# Patient Record
Sex: Female | Born: 1995
Health system: Southern US, Community
[De-identification: ages and names within clinical notes are randomized; demographics above are authoritative.]

## PROBLEM LIST (undated history)

## (undated) ENCOUNTER — Inpatient Hospital Stay (HOSPITAL_COMMUNITY): Payer: Self-pay

## (undated) DIAGNOSIS — M92522 Juvenile osteochondrosis of tibia tubercle, left leg: Secondary | ICD-10-CM

## (undated) DIAGNOSIS — L709 Acne, unspecified: Secondary | ICD-10-CM

## (undated) DIAGNOSIS — K219 Gastro-esophageal reflux disease without esophagitis: Secondary | ICD-10-CM

## (undated) DIAGNOSIS — E282 Polycystic ovarian syndrome: Secondary | ICD-10-CM

## (undated) DIAGNOSIS — M9252 Juvenile osteochondrosis of tibia and fibula, left leg: Secondary | ICD-10-CM

## (undated) HISTORY — DX: Gastro-esophageal reflux disease without esophagitis: K21.9

## (undated) HISTORY — DX: Polycystic ovarian syndrome: E28.2

## (undated) HISTORY — DX: Juvenile osteochondrosis of tibia tubercle, left leg: M92.522

## (undated) HISTORY — PX: NO PAST SURGERIES: SHX2092

## (undated) HISTORY — DX: Juvenile osteochondrosis of tibia and fibula, left leg: M92.52

---
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
## 2015-07-09 ENCOUNTER — Ambulatory Visit: Payer: 59 | Admitting: Internal Medicine

## 2015-07-09 DIAGNOSIS — Z0289 Encounter for other administrative examinations: Secondary | ICD-10-CM

## 2015-08-01 ENCOUNTER — Other Ambulatory Visit: Payer: Self-pay | Admitting: Internal Medicine

## 2015-08-24 ENCOUNTER — Other Ambulatory Visit: Payer: Self-pay | Admitting: Internal Medicine

## 2015-08-29 ENCOUNTER — Other Ambulatory Visit: Payer: Self-pay | Admitting: Internal Medicine

## 2015-09-15 ENCOUNTER — Other Ambulatory Visit: Payer: Self-pay | Admitting: Internal Medicine

## 2015-10-14 ENCOUNTER — Other Ambulatory Visit: Payer: Self-pay | Admitting: Internal Medicine

## 2015-10-17 ENCOUNTER — Other Ambulatory Visit: Payer: Self-pay | Admitting: Internal Medicine

## 2015-10-25 ENCOUNTER — Ambulatory Visit (INDEPENDENT_AMBULATORY_CARE_PROVIDER_SITE_OTHER): Payer: BLUE CROSS/BLUE SHIELD | Admitting: Family

## 2015-10-25 ENCOUNTER — Encounter: Payer: Self-pay | Admitting: Family

## 2015-10-25 VITALS — BP 128/84 | HR 76 | Temp 98.4°F | Resp 16 | Ht 65.0 in | Wt 166.0 lb

## 2015-10-25 DIAGNOSIS — K219 Gastro-esophageal reflux disease without esophagitis: Secondary | ICD-10-CM | POA: Diagnosis not present

## 2015-10-25 DIAGNOSIS — M7652 Patellar tendinitis, left knee: Secondary | ICD-10-CM

## 2015-10-25 DIAGNOSIS — Z23 Encounter for immunization: Secondary | ICD-10-CM

## 2015-10-25 DIAGNOSIS — M7650 Patellar tendinitis, unspecified knee: Secondary | ICD-10-CM | POA: Insufficient documentation

## 2015-10-25 MED ORDER — NAPROXEN 375 MG PO TABS: ORAL_TABLET | ORAL | 0 refills | Status: DC

## 2015-10-25 MED ORDER — OMEPRAZOLE 20 MG PO CPDR: DELAYED_RELEASE_CAPSULE | ORAL | 0 refills | Status: DC

## 2015-10-25 NOTE — Patient Instructions (Addendum)
Thank you for choosing [facility].  SUMMARY AND INSTRUCTIONS:  Ice your knee 20 min every 2 hours as needed and after activity.  Exercises daily.  Continue to take the naproxen as needed.   Medication:  Your prescription(s) have been submitted to your pharmacy or been printed and provided for you. Please take as directed and contact our office if you believe you are having problem(s) with the medication(s) or have any questions.  Follow up:  If your symptoms worsen or fail to improve, please contact our office for further instruction, or in case of emergency go directly to the emergency room at the closest medical facility.    Osgood-Schlatter Disease With Rehab Osgood-Schlatter disease affects the growth plate of the shinbone (tibia) just below the knee joint. The condition involves pain and inflammation below the knee. The tibial tubercle is a bony bump (prominence) below the knee, where the patellar tendon attaches to the shinbone. The patellar tendon is connected to the quadriceps thigh muscles, which are responsible for straightening the knee and bending the hip. In skeletally immature individuals, the tibial tubercle contains a growth plate that is vulnerable to injury, from stress placed on it by the patellar tendon. Osgood-Schlatter disease is a temporary condition that typically goes away with skeletal maturity (at about 20 years of age). SYMPTOMS   A slightly swollen, warm, and tender bump below the knee, where the patellar tendon inserts.  Pain with activity, especially straightening the leg against force (stair climbing, jumping, deep knee bends, weight-lifting) or following an extended period of vigorous exercise in an adolescent. In more severe cases, pain occurs during less vigorous activity. CAUSES  Osgood-Schlatter disease is caused by repeated stress to the tibial tubercle growth plate. This stress causes the area to become inflamed, resulting in pain.  RISK  INCREASES WITH:   Conditioning routines that are too strenuous, such as running, jumping, or jogging.  Being overweight.  Boys ages 2611 to 1318.  Rapid skeletal growth.  Poor strength and flexibility. PREVENTION  Maintain a healthy body weight.  Warm up and stretch properly before activity.  Allow for adequate recovery between workouts.  Learn and use proper exercise technique.  Maintain physical fitness:  Strength, flexibility, and endurance.  Cardiovascular fitness. PROGNOSIS  The outcome for Osgood-Schlatter disease depends on the severity of the condition. Mild cases may be resolved with a slight reduction of activity level. However, moderate to severe cases may require significantly reduced activity and, sometimes, restraining the knee for 3 to 4 months.  RELATED COMPLICATIONS   Bone infection.  Recurrence of the condition in adulthood, resulting in (symptomatic) bone fragments below the affected knee (ossicle).  Persisting bump, below the kneecap. TREATMENT Treatment first involves the use of ice and medicine, to reduce pain and inflammation. The use of strengthening and stretching exercises may help reduce pain with activity, especially exercising the quadriceps and hamstrings (thigh) muscles. These exercises may be performed at home or with a therapist. Activities that cause symptoms to get worse should be avoided, until symptoms begin to go away. Severe cases may be referred to a therapist for further evaluation and treatment. Uncommonly, the affected knee may be restrained for 6 to 8 weeks. Your caregiver may advise the use of a brace between kneecap and tibial tubercle, on top of the patellar tendon (patellar band), that may help relieve symptoms. Surgery is rarely needed in a skeletally immature individual, but it may be considered for skeletally mature individuals.  MEDICATION   If  pain medicine is needed, nonsteroidal anti-inflammatory medicines (aspirin and  ibuprofen), or other minor pain relievers (acetaminophen), are often advised.  Do not take pain medicine for 7 days before surgery.  Prescription pain relievers may be given, if your caregiver thinks they are needed. Use only as directed and only as much as you need. HEAT AND COLD  Cold treatment (icing) should be applied for 10 to 15 minutes every 2 to 3 hours for inflammation and pain, and immediately after activity that aggravates your symptoms. Use ice packs or an ice massage.  Heat treatment may be used before performing stretching and strengthening activities prescribed by your caregiver, physical therapist, or athletic trainer. Use a heat pack or a warm water soak. SEEK MEDICAL CARE IF:  Symptoms get worse or do not improve in 4 weeks, despite treatment.  You develop a fever greater than 102 F (38.9 C). EXERCISES RANGE OF MOTION (ROM) AND STRETCHING EXERCISES - Osgood-Schlatter Disease (Osteochondrosis, Apophysitis of the Tibial Tubercle) These exercises may help you when beginning to rehabilitate your injury. Your symptoms may resolve with or without further involvement from your physician, physical therapist or athletic trainer. While completing these exercises, remember:   Restoring tissue flexibility helps normal motion to return to the joints. This allows healthier, less painful movement and activity.  An effective stretch should be held for at least 30 seconds.  A stretch should never be painful. You should only feel a gentle lengthening or release in the stretched tissue. STRETCH - Quadriceps, Prone  Lie on your stomach on a firm surface, such as a bed or padded floor.  Bend your right / left knee and grasp your ankle. If you are unable to reach your ankle or pant leg, use a belt around your foot to lengthen your reach.  Gently pull your heel toward your buttocks. Your knee should not slide out to the side. You should feel a stretch in the front of your thigh and  knee.  Hold this position for __________ seconds. Repeat __________ times. Complete this stretch __________ times per day.  STRETCH - Hamstrings, Supine  Lie on your back. Loop a belt or towel over the ball of your right / left foot.  Straighten your right / left knee and slowly pull on the belt to raise your leg. Do not allow the right / left knee to bend Keep your opposite leg flat on the floor.  Raise the leg until you feel a gentle stretch behind your right / left knee or thigh. Hold this position for __________ seconds. Repeat __________ times. Complete this stretch __________ times per day.  STRETCH - Hamstrings, Doorway  Lie on your back with your right / left leg extended and resting on the wall, and the opposite leg flat on the ground, through the door. At first, position your bottom farther away from the wall.  Keep your right / left knee straight. If you feel a stretch behind your knee or thigh, hold this position for __________ seconds.  If you do not feel a stretch, scoot your bottom closer to the door, and hold __________ seconds. Repeat __________ times. Complete this stretch __________ times per day.  STRETCH - Hamstrings, Standing  Stand or sit and extend your right / left leg, placing your foot on a chair or foot stool.  Keep a slight arch in your low back and your hips straight forward.  Lead with your chest and lean forward at the waist until you feel a gentle  stretch in the back of your right / left knee or thigh. (When done correctly, this exercise requires leaning only a small distance.)  Hold this position for __________ seconds. Repeat __________ times. Complete this stretch __________ times per day. STRENGTHENING EXERCISES - Osgood-Schlatter Disease (Osteochondrosis, Apophysitis of the Tibial Tubercle) These exercises may help you when beginning to rehabilitate your injury. They may resolve your symptoms with or without further involvement from your physician,  physical therapist or athletic trainer. While completing these exercises, remember:   Muscles can gain both the endurance and the strength needed for everyday activities through controlled exercises.  Complete these exercises as instructed by your physician, physical therapist or athletic trainer. Increase the resistance and repetitions only as guided by your caregiver. STRENGTH - Quadriceps, Isometrics  Lie on your back with your right / left leg extended and your opposite knee bent.  Gradually tense the muscles in the front of your right / left thigh. You should see either your knee cap slide up toward your hip or increased dimpling just above the knee. This motion will push the back of the knee down toward the floor, mat, or bed on which you are lying.  Hold the muscle as tight as you can, without increasing your pain, for __________ seconds.  Relax the muscles slowly and completely in between each repetition. Repeat __________ times. Complete this exercise __________ times per day.  STRENGTH - Quadriceps, Short Arcs   Lie on your back. Place a __________ inch towel roll under your right / left knee, so that the knee bends slightly.  Raise only your lower leg by tightening the muscles in the front of your thigh. Do not allow your thigh to rise.  Hold this position for __________ seconds. Repeat __________ times. Complete this exercise __________ times per day.  OPTIONAL ANKLE WEIGHTS: Begin with ____________________, but DO NOT exceed ____________________. Increase in 1 pound/0.5 kilogram increments. STRENGTH - Quadriceps, Straight Leg Raises Quality counts! Watch for signs that the quadriceps muscle is working, to be sure you are strengthening the correct muscles and not "cheating" by substituting with healthier muscles.  Lay on your back with your right / left leg extended and your opposite knee bent.  Tense the muscles in the front of your right / left thigh. You should see either  your knee cap slide up or increased dimpling just above the knee. Your thigh may even shake a bit.  Tighten these muscles even more and raise your leg 4 to 6 inches off the floor. Hold for __________ seconds.  Keeping these muscles tense, lower your leg.  Relax the muscles slowly and completely between each repetition. Repeat __________ times. Complete this exercise __________ times per day.   This information is not intended to replace advice given to you by your health care provider. Make sure you discuss any questions you have with your health care provider.   Document Released: 01/30/2005 Document Revised: 10/21/2014 Document Reviewed: 05/14/2008 Elsevier Interactive Patient Education 2016 [employer].   Food Choices for Gastroesophageal Reflux Disease, Adult When you have gastroesophageal reflux disease (GERD), the foods you eat and your eating habits are very important. Choosing the right foods can help ease the discomfort of GERD. WHAT GENERAL GUIDELINES DO I NEED TO FOLLOW?  Choose fruits, vegetables, whole grains, low-fat dairy products, and low-fat meat, fish, and poultry.  Limit fats such as oils, salad dressings, butter, nuts, and avocado.  Keep a food diary to identify foods that cause symptoms.  Avoid foods that cause reflux. These may be different for different people.  Eat frequent small meals instead of three large meals each day.  Eat your meals slowly, in a relaxed setting.  Limit fried foods.  Cook foods using methods other than frying.  Avoid drinking alcohol.  Avoid drinking large amounts of liquids with your meals.  Avoid bending over or lying down until 2-3 hours after eating. WHAT FOODS ARE NOT RECOMMENDED? The following are some foods and drinks that may worsen your symptoms: Vegetables Tomatoes. Tomato juice. Tomato and spaghetti sauce. Chili peppers. Onion and garlic. Horseradish. Fruits Oranges, grapefruit, and lemon (fruit and  juice). Meats High-fat meats, fish, and poultry. This includes hot dogs, ribs, ham, sausage, salami, and bacon. Dairy Whole milk and chocolate milk. Sour cream. Cream. Butter. Ice cream. Cream cheese.  Beverages Coffee and tea, with or without caffeine. Carbonated beverages or energy drinks. Condiments Hot sauce. Barbecue sauce.  Sweets/Desserts Chocolate and cocoa. Donuts. Peppermint and spearmint. Fats and Oils High-fat foods, including [place] fries and potato chips. Other Vinegar. Strong spices, such as black pepper, white pepper, red pepper, cayenne, curry powder, cloves, ginger, and chili powder. The items listed above may not be a complete list of foods and beverages to avoid. Contact your dietitian for more information.   This information is not intended to replace advice given to you by your health care provider. Make sure you discuss any questions you have with your health care provider.   Document Released: 01/30/2005 Document Revised: 02/20/2014 Document Reviewed: 12/04/2012 Elsevier Interactive Patient Education [employer]

## 2015-10-25 NOTE — Assessment & Plan Note (Deleted)
Symptoms and exam consistent with patellar tendinitis given no tenderness over the tibial tuberosity or the inferior pole of the patella. Well controlled with naproxen. Not currently using nonpharmacological therapies. Encouraged to ice and initiate home exercise therapy. Continue current dosage of naproxen. Follow-up if symptoms worsen or fail to improve.

## 2015-10-25 NOTE — Assessment & Plan Note (Signed)
Symptoms of gastroesophageal reflux a periodically controlled with current regimen. Not currently following a modified diet. Information on foods to avoid provided and after visit summary. Continue current dosage of omeprazole.

## 2015-10-25 NOTE — Assessment & Plan Note (Signed)
Symptoms and exam consistent with patellar tendinitis given no tenderness over the tibial tuberosity or the inferior pole of the patella. Well controlled with naproxen. Not currently using nonpharmacological therapies. Encouraged to ice and initiate home exercise therapy. Continue current dosage of naproxen. Follow-up if symptoms worsen or fail to improve. 

## 2015-10-25 NOTE — Progress Notes (Signed)
Subjective:    Patient ID: Amanda Harper, female    DOB: 1995/10/30, 20 y.o.   MRN: [id number]  Chief Complaint  Patient presents with  . Medication Refill    needs refills of all medications, has congestion    HPI:  Amanda Harper is a 20 y.o. female who  has a past medical history of GERD (gastroesophageal reflux disease); Osgood-Harper's disease of left knee; and PCOS (polycystic ovarian syndrome). and presents today for a follow up office visit.  1.) GERD - Currently maintained on omeprazole. Reports taking the medication as prescribed and denies adverse side effects. Symptoms are generally well controlled with the current regimen. Not currently following a GERD related diet.  2.) Amanda Harper - Currently maintained on naproxen. Reports taking the medication one time per day. Continues to experience pain located in her left knee described as sharp, dull and achy. Aggrevated after sitting for long periods of time. Has not been doing any exercises or non-pharmacological therapies.    Allergies  Allergen Reactions  . Other Rash    Henna dye      Outpatient Medications Prior to Visit  Medication Sig Dispense Refill  . norgestimate-ethinyl estradiol (ORTHO-CYCLEN,SPRINTEC,PREVIFEM) 0.25-35 MG-MCG tablet Take 1 tablet by mouth daily. 1 Package 11  . naproxen (NAPROSYN) 375 MG tablet TAKE 1 TABLET EVERY DAY AS NEEDED FOR PAIN 30 tablet 0  . omeprazole (PRILOSEC) 20 MG capsule TAKE 1 CAPSULE BY MOUTH DAILY. YEARLY PHYSICAL W/LABS ARE DUE MUST SEE MD FOR REFILLS 30 capsule 0  . cefUROXime (CEFTIN) 500 MG tablet Take 1 tablet (500 mg total) by mouth 2 (two) times daily. 20 tablet 0   No facility-administered medications prior to visit.     Review of Systems  Constitutional: Negative for chills and fever.  Respiratory: Negative for chest tightness and shortness of breath.   Cardiovascular: Negative for chest pain.  Gastrointestinal: Negative for abdominal pain,  constipation, diarrhea, nausea and vomiting.  Neurological: Negative for weakness and numbness.      Objective:    BP 128/84 (BP Location: Left Arm, Patient Position: Sitting, Cuff Size: Normal)   Pulse 76   Temp 98.4 F (36.9 C) (Oral)   Resp 16   Ht 5\' 5"  (1.651 m)   Wt 166 lb (75.3 kg)   SpO2 99%   BMI 27.62 kg/m  Nursing note and vital signs reviewed.  Physical Exam  Constitutional: She is oriented to person, place, and time. She appears well-developed and well-nourished. No distress.  Cardiovascular: Normal rate, regular rhythm, normal heart sounds and intact distal pulses.   Pulmonary/Chest: Effort normal and breath sounds normal.  Abdominal: Normal appearance and bowel sounds are normal. She exhibits no mass. There is no hepatosplenomegaly. There is no tenderness. There is no rigidity, no rebound, no guarding, no tenderness at McBurney's point and negative Murphy's sign.  Musculoskeletal:  Left knee - no obvious deformity, discoloration, or edema. Mild tenderness located along medial and lateral aspects of patellar tendon. No crepitus or deformities appreciated. Range of motion within normal limits. Strength is 5+. Ligamentous and meniscal testing are negative. Distal pulses and sensation are intact and appropriate.  Neurological: She is alert and oriented to person, place, and time.  Skin: Skin is warm and dry.  Psychiatric: She has a normal mood and affect. Her behavior is normal. Judgment and thought content normal.       Assessment & Plan:   Problem List Items Addressed This Visit  Digestive   GERD (gastroesophageal reflux disease) - Primary    Symptoms of gastroesophageal reflux a periodically controlled with current regimen. Not currently following a modified diet. Information on foods to avoid provided and after visit summary. Continue current dosage of omeprazole.      Relevant Medications   omeprazole (PRILOSEC) 20 MG capsule     Musculoskeletal and  Integument   Patellar tendinitis    Symptoms and exam consistent with patellar tendinitis given no tenderness over the tibial tuberosity or the inferior pole of the patella. Well controlled with naproxen. Not currently using nonpharmacological therapies. Encouraged to ice and initiate home exercise therapy. Continue current dosage of naproxen. Follow-up if symptoms worsen or fail to improve.      Relevant Medications   naproxen (NAPROSYN) 375 MG tablet    Other Visit Diagnoses    Encounter for immunization       Relevant Medications   omeprazole (PRILOSEC) 20 MG capsule   naproxen (NAPROSYN) 375 MG tablet   Other Relevant Orders   Flu Vaccine QUAD 36+ mos IM (Completed)   Tdap vaccine greater than or equal to 7yo IM (Completed)   Need for diphtheria-tetanus-pertussis (Tdap) vaccine, adult/adolescent       Relevant Orders   Tdap vaccine greater than or equal to 7yo IM (Completed)       I have discontinued Amanda Harper's cefUROXime. I have also changed her omeprazole. Additionally, I am having her maintain her norgestimate-ethinyl estradiol and naproxen.   Meds ordered this encounter  Medications  . omeprazole (PRILOSEC) 20 MG capsule    Sig: TAKE 1 CAPSULE BY MOUTH DAILY.    Dispense:  90 capsule    Refill:  0    PT REQUESTS REFILL    Order Specific Question:   Supervising Provider    Answer:   Hillard Danker A [[id number]]  . naproxen (NAPROSYN) 375 MG tablet    Sig: TAKE 1 TABLET EVERY DAY AS NEEDED FOR PAIN    Dispense:  90 tablet    Refill:  0    PT REQUESTS REFILL    Order Specific Question:   Supervising Provider    Answer:   Myrlene Broker [[id number]]     Follow-up: Return in about 3 months (around 01/24/2016), or if symptoms worsen or fail to improve.  Jeanine Luz, FNP

## 2015-11-15 ENCOUNTER — Ambulatory Visit (INDEPENDENT_AMBULATORY_CARE_PROVIDER_SITE_OTHER): Payer: BLUE CROSS/BLUE SHIELD | Admitting: Internal Medicine

## 2015-11-15 ENCOUNTER — Other Ambulatory Visit (INDEPENDENT_AMBULATORY_CARE_PROVIDER_SITE_OTHER): Payer: BLUE CROSS/BLUE SHIELD

## 2015-11-15 ENCOUNTER — Encounter: Payer: Self-pay | Admitting: Internal Medicine

## 2015-11-15 VITALS — BP 120/70 | HR 84 | Temp 98.5°F | Resp 16 | Ht 65.0 in | Wt 167.2 lb

## 2015-11-15 DIAGNOSIS — H833X3 Noise effects on inner ear, bilateral: Secondary | ICD-10-CM | POA: Diagnosis not present

## 2015-11-15 DIAGNOSIS — Z0000 Encounter for general adult medical examination without abnormal findings: Secondary | ICD-10-CM

## 2015-11-15 HISTORY — DX: Noise effects on inner ear, bilateral: H83.3X3

## 2015-11-15 LAB — LIPID PANEL
CHOLESTEROL: 185 mg/dL (ref 0–200)
HDL: 54.4 mg/dL (ref >39.00)
LDL CALC: 108 mg/dL — AB (ref 0–99)
NonHDL: 130.97
TRIGLYCERIDES: 114 mg/dL (ref 0.0–149.0)
Total CHOL/HDL Ratio: 3
VLDL: 22.8 mg/dL (ref 0.0–40.0)

## 2015-11-15 LAB — CBC WITH DIFFERENTIAL/PLATELET
BASOS ABS: 0 10*3/uL (ref 0.0–0.1)
Basophils Relative: 0.5 % (ref 0.0–3.0)
Eosinophils Absolute: 0.1 10*3/uL (ref 0.0–0.7)
Eosinophils Relative: 1.5 % (ref 0.0–5.0)
HCT: 36.7 % (ref 36.0–46.0)
Hemoglobin: 12.4 g/dL (ref 12.0–15.0)
LYMPHS ABS: 2.4 10*3/uL (ref 0.7–4.0)
Lymphocytes Relative: 48 % — ABNORMAL HIGH (ref 12.0–46.0)
MCHC: 33.8 g/dL (ref 30.0–36.0)
MCV: 77.8 fl — ABNORMAL LOW (ref 78.0–100.0)
MONO ABS: 0.4 10*3/uL (ref 0.1–1.0)
Monocytes Relative: 7.8 % (ref 3.0–12.0)
NEUTROS PCT: 42.2 % — AB (ref 43.0–77.0)
Neutro Abs: 2.1 10*3/uL (ref 1.4–7.7)
Platelets: 274 10*3/uL (ref 150.0–400.0)
RBC: 4.71 Mil/uL (ref 3.87–5.11)
RDW: 13.1 % (ref 11.5–14.6)
WBC: 4.9 10*3/uL (ref 4.5–10.5)

## 2015-11-15 LAB — TSH: TSH: 0.94 u[IU]/mL (ref 0.35–5.50)

## 2015-11-15 LAB — COMPREHENSIVE METABOLIC PANEL
ALT: 10 U/L (ref 0–35)
AST: 12 U/L (ref 0–37)
Albumin: 3.5 g/dL (ref 3.5–5.2)
Alkaline Phosphatase: 58 U/L (ref 39–117)
BILIRUBIN TOTAL: 0.3 mg/dL (ref 0.2–1.2)
BUN: 10 mg/dL (ref 6–23)
CO2: 27 meq/L (ref 19–32)
Calcium: 8.9 mg/dL (ref 8.4–10.5)
Chloride: 104 mEq/L (ref 96–112)
Creatinine, Ser: 0.59 mg/dL (ref 0.40–1.20)
GFR: 165.99 mL/min (ref >60.00)
Glucose, Bld: 88 mg/dL (ref 70–99)
Potassium: 3.6 mEq/L (ref 3.5–5.1)
SODIUM: 138 meq/L (ref 135–145)
TOTAL PROTEIN: 7.4 g/dL (ref 6.0–8.3)

## 2015-11-15 NOTE — Progress Notes (Signed)
Pre visit review using our clinic review tool, if applicable. No additional management support is needed unless otherwise documented below in the visit note. 

## 2015-11-15 NOTE — Progress Notes (Signed)
Subjective:  Patient ID: Amanda Harper, female    DOB: 1995/03/25  Age: 20 y.o. MRN: [id number]  CC: Annual Exam   HPI Amanda Harper presents for a CPX.  She complains of gradual, worsening hearing loss in both ears over the last 3 months. She admits that she wears ear buds multiple hours during the day and night and listens to loud music. She denies ringing in Amanda Harper ears, ear pain, dizziness, vertigo, headache, or rash.  Outpatient Medications Prior to Visit  Medication Sig Dispense Refill  . naproxen (NAPROSYN) 375 MG tablet TAKE 1 TABLET EVERY DAY AS NEEDED FOR PAIN 90 tablet 0  . norgestimate-ethinyl estradiol (ORTHO-CYCLEN,SPRINTEC,PREVIFEM) 0.25-35 MG-MCG tablet Take 1 tablet by mouth daily. 1 Package 11  . omeprazole (PRILOSEC) 20 MG capsule TAKE 1 CAPSULE BY MOUTH DAILY. 90 capsule 0   No facility-administered medications prior to visit.     ROS Review of Systems  Constitutional: Negative.  Negative for chills, fatigue and fever.  HENT: Positive for hearing loss. Negative for congestion, facial swelling, sinus pressure, sore throat and trouble swallowing.   Eyes: Negative.  Negative for photophobia and visual disturbance.  Respiratory: Negative.  Negative for cough, choking, chest tightness, shortness of breath and stridor.   Cardiovascular: Negative.  Negative for chest pain, palpitations and leg swelling.  Gastrointestinal: Negative.  Negative for abdominal pain, constipation, diarrhea, nausea and vomiting.  Endocrine: Negative.   Genitourinary: Negative.  Negative for difficulty urinating, menstrual problem and pelvic pain.  Musculoskeletal: Negative.  Negative for arthralgias, back pain, joint swelling and myalgias.  Skin: Negative.   Allergic/Immunologic: Negative.   Neurological: Negative.  Negative for dizziness, facial asymmetry, numbness and headaches.  Hematological: Negative.  Negative for adenopathy. Does not bruise/bleed easily.  Psychiatric/Behavioral:  Negative.     Objective:  BP 120/70 (BP Location: Left Arm, Patient Position: Sitting, Cuff Size: Normal)   Pulse 84   Temp 98.5 F (36.9 C) (Oral)   Resp 16   Ht 5\' 5"  (1.651 m)   Wt 167 lb 4 oz (75.9 kg)   SpO2 98%   BMI 27.83 kg/m   BP Readings from Last 3 Encounters:  11/15/15 120/70  10/25/15 128/84  07/29/14 118/70    Wt Readings from Last 3 Encounters:  11/15/15 167 lb 4 oz (75.9 kg)  10/25/15 166 lb (75.3 kg)  07/29/14 203 lb (92.1 kg) (98 %, Z= 1.99)*   * Growth percentiles are based on CDC 2-20 Years data.    Physical Exam  Constitutional: She is oriented to person, place, and time. She appears well-developed and well-nourished. No distress.  HENT:  Head: Normocephalic and atraumatic.  Right Ear: Tympanic membrane, external ear and ear canal normal. No mastoid tenderness. Decreased hearing is noted.  Left Ear: Tympanic membrane, external ear and ear canal normal. No mastoid tenderness. Decreased hearing is noted.  Nose: Nose normal.  Mouth/Throat: Oropharynx is clear and moist. No oropharyngeal exudate.  Eyes: Conjunctivae are normal. Right eye exhibits no discharge. Left eye exhibits no discharge. No scleral icterus.  Neck: Normal range of motion. Neck supple. No JVD present. No tracheal deviation present. No thyromegaly present.  Cardiovascular: Normal rate, regular rhythm, normal heart sounds and intact distal pulses.  Exam reveals no gallop and no friction rub.   No murmur heard. Pulmonary/Chest: Effort normal and breath sounds normal. No stridor. No respiratory distress. She has no wheezes. She has no rales. She exhibits no tenderness.  Abdominal: Soft. Bowel sounds are normal.  She exhibits no distension and no mass. There is no tenderness. There is no rebound and no guarding.  Musculoskeletal: Normal range of motion. She exhibits no edema, tenderness or deformity.  Lymphadenopathy:    She has no cervical adenopathy.  Neurological: She is oriented to  person, place, and time.  Skin: Skin is warm and dry. No rash noted. She is not diaphoretic. No erythema. No pallor.  Vitals reviewed.   Lab Results  Component Value Date   WBC 4.9 11/15/2015   HGB 12.4 11/15/2015   HCT 36.7 11/15/2015   PLT 274.0 11/15/2015   GLUCOSE 88 11/15/2015   CHOL 185 11/15/2015   TRIG 114.0 11/15/2015   HDL 54.40 11/15/2015   LDLCALC 108 (H) 11/15/2015   ALT 10 11/15/2015   AST 12 11/15/2015   NA 138 11/15/2015   K 3.6 11/15/2015   CL 104 11/15/2015   CREATININE 0.59 11/15/2015   BUN 10 11/15/2015   CO2 27 11/15/2015   TSH 0.94 11/15/2015    No results found.  Assessment & Plan:   Amanda Harper was seen today for annual exam.  Diagnoses and all orders for this visit:  Routine general medical examination at a health care facility- exam completed, labs ordered and reviewed, Amanda Harper Pap is up-to-date, patient education material was given. -     Lipid panel; Future -     CBC with Differential/Platelet; Future -     Comprehensive metabolic panel; Future -     TSH; Future  Noise-induced hearing loss of both ears- audiology referral -     Ambulatory referral to Audiology   I am having Amanda Harper maintain Amanda Harper norgestimate-ethinyl estradiol, omeprazole, naproxen, and EPIDUO.  Meds ordered this encounter  Medications  . EPIDUO 0.1-2.5 % gel    Sig: APPLY A THIN LAYER ONCE NIGHTLY TO THE FACE, CHEST, AND BACK    Refill:  5     Follow-up: Return if symptoms worsen or fail to improve.  Sanda Lingerhomas Zameria Vogl, MD

## 2015-11-15 NOTE — Patient Instructions (Signed)
Hearing Loss °Hearing loss is a partial or total loss of the ability to hear. This can be temporary or permanent, and it can happen in one or both ears. Hearing loss may be referred to as deafness. °Medical care is necessary to treat hearing loss properly and to prevent the condition from getting worse. Your hearing may partially or completely come back, depending on what caused your hearing loss and how severe it is. In some cases, hearing loss is permanent. °CAUSES °Common causes of hearing loss include:  °· Too much wax in the ear canal.   °· Infection of the ear canal or middle ear.   °· Fluid in the middle ear.   °· Injury to the ear or surrounding area.   °· An object stuck in the ear.   °· Prolonged exposure to loud sounds, such as music.   °Less common causes of hearing loss include:  °· Tumors in the ear.   °· Viral or bacterial infections, such as meningitis.   °· A hole in the eardrum (perforated eardrum). °· Problems with the hearing nerve that sends signals between the brain and the ear. °· Certain medicines.   °SYMPTOMS  °Symptoms of this condition may include: °· Difficulty telling the difference between sounds. °· Difficulty following a conversation when there is background noise. °· Lack of response to sounds in your environment. This may be most noticeable when you do not respond to startling sounds. °· Needing to turn up the volume on the television, radio, etc. °· Ringing in the ears. °· Dizziness. °· Pain in the ears. °DIAGNOSIS °This condition is diagnosed based on a physical exam and a hearing test (audiometry). The audiometry test will be performed by a hearing specialist (audiologist). You may also be referred to an ear, nose, and throat (ENT) specialist (otolaryngologist).  °TREATMENT °Treatment for recent onset of hearing loss may include:  °· Ear wax removal.   °· Being prescribed medicines to prevent infection (antibiotics).   °· Being prescribed medicines to reduce inflammation  (corticosteroids).   °HOME CARE INSTRUCTIONS °· If you were prescribed an antibiotic medicine, take it as told by your health care provider. Do not stop taking the antibiotic even if you start to feel better. °· Take over-the-counter and prescription medicines only as told by your health care provider. °· Avoid loud noises.   °· Return to your normal activities as told by your health care provider. Ask your health care provider what activities are safe for you. °· Keep all follow-up visits as told by your health care provider. This is important. °SEEK MEDICAL CARE IF:  °· You feel dizzy.   °· You develop new symptoms.   °· You vomit or feel nauseous.   °· You have a fever.   °SEEK IMMEDIATE MEDICAL CARE IF: °· You develop sudden changes in your vision.   °· You have severe ear pain.   °· You have new or increased weakness. °· You have a severe headache. °  °This information is not intended to replace advice given to you by your health care provider. Make sure you discuss any questions you have with your health care provider. °  °Document Released: 01/30/2005 Document Revised: 10/21/2014 Document Reviewed: 06/17/2014 °Elsevier Interactive Patient Education ©2016 [employer] ° °

## 2015-11-16 ENCOUNTER — Encounter: Payer: Self-pay | Admitting: Internal Medicine

## 2016-01-17 ENCOUNTER — Other Ambulatory Visit: Payer: Self-pay | Admitting: Family

## 2016-01-17 DIAGNOSIS — M7652 Patellar tendinitis, left knee: Secondary | ICD-10-CM

## 2016-01-17 DIAGNOSIS — K219 Gastro-esophageal reflux disease without esophagitis: Secondary | ICD-10-CM

## 2016-01-20 ENCOUNTER — Ambulatory Visit: Payer: BLUE CROSS/BLUE SHIELD | Admitting: Internal Medicine

## 2016-04-26 ENCOUNTER — Other Ambulatory Visit: Payer: Self-pay | Admitting: Internal Medicine

## 2016-04-26 DIAGNOSIS — M7652 Patellar tendinitis, left knee: Secondary | ICD-10-CM

## 2016-05-07 ENCOUNTER — Other Ambulatory Visit: Payer: Self-pay | Admitting: Internal Medicine

## 2016-05-07 DIAGNOSIS — M7652 Patellar tendinitis, left knee: Secondary | ICD-10-CM

## 2016-08-11 ENCOUNTER — Other Ambulatory Visit: Payer: Self-pay | Admitting: Internal Medicine

## 2016-08-11 DIAGNOSIS — M7652 Patellar tendinitis, left knee: Secondary | ICD-10-CM

## 2016-10-18 ENCOUNTER — Other Ambulatory Visit: Payer: Self-pay | Admitting: Internal Medicine

## 2016-10-18 DIAGNOSIS — K219 Gastro-esophageal reflux disease without esophagitis: Secondary | ICD-10-CM

## 2016-10-18 NOTE — Telephone Encounter (Signed)
Can not refill medication until patient has scheduled an appt. It is not needed until October. Can you call the patient for me please?

## 2016-10-18 NOTE — Telephone Encounter (Signed)
Mother states she will get patient to call office to schedule appt for October

## 2016-10-20 NOTE — Telephone Encounter (Signed)
Pt called to schedule. The first available apt that she was able to get in is on October 15th. She wanted to know if her medications could be filled until this apt.

## 2016-10-25 NOTE — Telephone Encounter (Signed)
Per office policy sent 30 day to local pharmacy until appt.../lmb  

## 2016-11-14 ENCOUNTER — Other Ambulatory Visit: Payer: Self-pay | Admitting: Internal Medicine

## 2016-11-14 DIAGNOSIS — M7652 Patellar tendinitis, left knee: Secondary | ICD-10-CM

## 2016-11-14 NOTE — Telephone Encounter (Signed)
Can you advise in PCP absence please?   Appt scheduled for 11/27/2016

## 2016-11-22 ENCOUNTER — Other Ambulatory Visit: Payer: Self-pay | Admitting: Internal Medicine

## 2016-11-22 DIAGNOSIS — K219 Gastro-esophageal reflux disease without esophagitis: Secondary | ICD-10-CM

## 2016-11-27 ENCOUNTER — Encounter: Payer: Self-pay | Admitting: Internal Medicine

## 2016-11-27 ENCOUNTER — Ambulatory Visit (INDEPENDENT_AMBULATORY_CARE_PROVIDER_SITE_OTHER): Payer: BLUE CROSS/BLUE SHIELD | Admitting: Internal Medicine

## 2016-11-27 ENCOUNTER — Other Ambulatory Visit (INDEPENDENT_AMBULATORY_CARE_PROVIDER_SITE_OTHER): Payer: BLUE CROSS/BLUE SHIELD

## 2016-11-27 VITALS — BP 110/74 | HR 83 | Temp 98.9°F | Resp 16 | Ht 65.0 in | Wt 160.0 lb

## 2016-11-27 DIAGNOSIS — E282 Polycystic ovarian syndrome: Secondary | ICD-10-CM | POA: Diagnosis not present

## 2016-11-27 DIAGNOSIS — Z23 Encounter for immunization: Secondary | ICD-10-CM | POA: Diagnosis not present

## 2016-11-27 DIAGNOSIS — Z0000 Encounter for general adult medical examination without abnormal findings: Secondary | ICD-10-CM

## 2016-11-27 DIAGNOSIS — K2100 Gastro-esophageal reflux disease with esophagitis, without bleeding: Secondary | ICD-10-CM

## 2016-11-27 DIAGNOSIS — J301 Allergic rhinitis due to pollen: Secondary | ICD-10-CM

## 2016-11-27 DIAGNOSIS — M7650 Patellar tendinitis, unspecified knee: Secondary | ICD-10-CM

## 2016-11-27 DIAGNOSIS — J309 Allergic rhinitis, unspecified: Secondary | ICD-10-CM | POA: Insufficient documentation

## 2016-11-27 DIAGNOSIS — L709 Acne, unspecified: Secondary | ICD-10-CM | POA: Diagnosis not present

## 2016-11-27 DIAGNOSIS — M7652 Patellar tendinitis, left knee: Secondary | ICD-10-CM | POA: Diagnosis not present

## 2016-11-27 LAB — LIPID PANEL
Cholesterol: 187 mg/dL (ref 0–200)
HDL: 60.6 mg/dL (ref >39.00)
LDL CALC: 112 mg/dL — AB (ref 0–99)
NONHDL: 126.72
Total CHOL/HDL Ratio: 3
Triglycerides: 73 mg/dL (ref 0.0–149.0)
VLDL: 14.6 mg/dL (ref 0.0–40.0)

## 2016-11-27 LAB — COMPREHENSIVE METABOLIC PANEL
ALBUMIN: 3.7 g/dL (ref 3.5–5.2)
ALK PHOS: 61 U/L (ref 39–117)
ALT: 9 U/L (ref 0–35)
AST: 11 U/L (ref 0–37)
BUN: 9 mg/dL (ref 6–23)
CO2: 27 mEq/L (ref 19–32)
Calcium: 9.2 mg/dL (ref 8.4–10.5)
Chloride: 103 mEq/L (ref 96–112)
Creatinine, Ser: 0.61 mg/dL (ref 0.40–1.20)
GFR: 158.15 mL/min (ref >60.00)
GLUCOSE: 104 mg/dL — AB (ref 70–99)
Potassium: 4 mEq/L (ref 3.5–5.1)
SODIUM: 136 meq/L (ref 135–145)
TOTAL PROTEIN: 7.2 g/dL (ref 6.0–8.3)
Total Bilirubin: 0.3 mg/dL (ref 0.2–1.2)

## 2016-11-27 LAB — CBC WITH DIFFERENTIAL/PLATELET
BASOS PCT: 0.7 % (ref 0.0–3.0)
Basophils Absolute: 0 10*3/uL (ref 0.0–0.1)
EOS PCT: 1.4 % (ref 0.0–5.0)
Eosinophils Absolute: 0.1 10*3/uL (ref 0.0–0.7)
HCT: 37.8 % (ref 36.0–46.0)
HEMOGLOBIN: 12.4 g/dL (ref 12.0–15.0)
LYMPHS ABS: 2.6 10*3/uL (ref 0.7–4.0)
Lymphocytes Relative: 54.4 % — ABNORMAL HIGH (ref 12.0–46.0)
MCHC: 32.7 g/dL (ref 30.0–36.0)
MCV: 80 fl (ref 78.0–100.0)
MONOS PCT: 8.7 % (ref 3.0–12.0)
Monocytes Absolute: 0.4 10*3/uL (ref 0.1–1.0)
NEUTROS PCT: 34.8 % — AB (ref 43.0–77.0)
Neutro Abs: 1.6 10*3/uL (ref 1.4–7.7)
Platelets: 240 10*3/uL (ref 150.0–400.0)
RBC: 4.72 Mil/uL (ref 3.87–5.11)
RDW: 13 % (ref 11.5–15.5)
WBC: 4.7 10*3/uL (ref 4.0–10.5)

## 2016-11-27 MED ORDER — NAPROXEN 375 MG PO TABS: ORAL_TABLET | ORAL | 1 refills | Status: DC

## 2016-11-27 MED ORDER — EPIDUO 0.1-2.5 % EX GEL: CUTANEOUS | 5 refills | Status: DC

## 2016-11-27 MED ORDER — NORGESTIMATE-ETH ESTRADIOL 0.25-35 MG-MCG PO TABS: 1.0000 | ORAL_TABLET | Freq: Every day | ORAL | 11 refills | Status: DC

## 2016-11-27 MED ORDER — LEVOCETIRIZINE DIHYDROCHLORIDE 5 MG PO TABS: 5.0000 mg | ORAL_TABLET | Freq: Every evening | ORAL | 1 refills | Status: DC

## 2016-11-27 NOTE — Progress Notes (Signed)
Subjective:  Patient ID: Amanda Harper, female    DOB: 07/21/1995  Age: 21 y.o. MRN: [id number]  CC: Annual Exam; Gastroesophageal Reflux; and Allergic Rhinitis    HPI CHRISTOL THETFORD presents for a CPX and RX refills. She complains of intermittent knee pain and wants a refill on her anti-inflammatory. Her acne is well-controlled with Epiduo. She complains of a several week history of nasal congestion, sneezing, and runny nose.  Outpatient Medications Prior to Visit  Medication Sig Dispense Refill  . omeprazole (PRILOSEC) 20 MG capsule Take 1 capsule (20 mg total) by mouth daily. 90 capsule 1  . EPIDUO 0.1-2.5 % gel APPLY A THIN LAYER ONCE NIGHTLY TO THE FACE, CHEST, AND BACK  5  . naproxen (NAPROSYN) 375 MG tablet TAKE 1 TABLET EVERY DAY AS NEEDED FOR PAIN 90 tablet 0  . norgestimate-ethinyl estradiol (ORTHO-CYCLEN,SPRINTEC,PREVIFEM) 0.25-35 MG-MCG tablet Take 1 tablet by mouth daily. 1 Package 11   No facility-administered medications prior to visit.     ROS Review of Systems  Constitutional: Negative.  Negative for appetite change, fatigue and unexpected weight change.  HENT: Positive for congestion, postnasal drip and rhinorrhea. Negative for facial swelling, sinus pain, sinus pressure, sore throat, trouble swallowing and voice change.   Eyes: Negative.   Respiratory: Negative.  Negative for cough, chest tightness, shortness of breath and wheezing.   Cardiovascular: Negative for chest pain, palpitations and leg swelling.  Gastrointestinal: Negative for abdominal pain, constipation, diarrhea, nausea and vomiting.  Endocrine: Negative.   Genitourinary: Negative.  Negative for difficulty urinating and menstrual problem.  Musculoskeletal: Positive for arthralgias. Negative for back pain, myalgias and neck pain.  Skin: Negative.  Negative for color change.  Allergic/Immunologic: Negative.   Neurological: Negative.  Negative for dizziness, weakness, numbness and headaches.    Hematological: Negative for adenopathy. Does not bruise/bleed easily.  Psychiatric/Behavioral: Negative.     Objective:  BP 110/74 (BP Location: Left Arm, Patient Position: Sitting, Cuff Size: Normal)   Pulse 83   Temp 98.9 F (37.2 C) (Oral)   Resp 16   Ht  (1.651 m)   Wt 160 lb (72.6 kg)   LMP 11/17/2016   SpO2 99%   BMI 26.63 kg/m   BP Readings from Last 3 Encounters:  11/27/16 110/74  11/15/15 120/70  10/25/15 128/84    Wt Readings from Last 3 Encounters:  11/27/16 160 lb (72.6 kg)  11/15/15 167 lb 4 oz (75.9 kg)  10/25/15 166 lb (75.3 kg)    Physical Exam  Constitutional: She is oriented to person, place, and time. No distress.  HENT:  Nose: Mucosal edema present. No rhinorrhea or sinus tenderness. No epistaxis.  Mouth/Throat: Oropharynx is clear and moist. No oropharyngeal exudate.  Eyes: Conjunctivae are normal. Right eye exhibits no discharge. Left eye exhibits no discharge. No scleral icterus.  Neck: Normal range of motion. Neck supple. No JVD present. No thyromegaly present.  Cardiovascular: Normal rate, regular rhythm and intact distal pulses.  Exam reveals no gallop and no friction rub.   No murmur heard. Pulmonary/Chest: Effort normal and breath sounds normal. No respiratory distress. She has no wheezes. She has no rales. She exhibits no tenderness.  Abdominal: Soft. Bowel sounds are normal. She exhibits no distension and no mass. There is no tenderness. There is no rebound and no guarding.  Musculoskeletal: Normal range of motion. She exhibits no edema, tenderness or deformity.  Lymphadenopathy:    She has no cervical adenopathy.  Neurological: She is alert  and oriented to person, place, and time.  Skin: Skin is warm and dry. No rash noted. She is not diaphoretic. No erythema. No pallor.  Psychiatric: She has a normal mood and affect. Her behavior is normal. Judgment and thought content normal.  Vitals reviewed.   Lab Results  Component Value  Date   WBC 4.7 11/27/2016   HGB 12.4 11/27/2016   HCT 37.8 11/27/2016   PLT 240.0 11/27/2016   GLUCOSE 104 (H) 11/27/2016   CHOL 187 11/27/2016   TRIG 73.0 11/27/2016   HDL 60.60 11/27/2016   LDLCALC 112 (H) 11/27/2016   ALT 9 11/27/2016   AST 11 11/27/2016   NA 136 11/27/2016   K 4.0 11/27/2016   CL 103 11/27/2016   CREATININE 0.61 11/27/2016   BUN 9 11/27/2016   CO2 27 11/27/2016   TSH 0.94 11/15/2015    No results found.  Assessment & Plan:   Kalman Shan was seen today for annual exam, gastroesophageal reflux and allergic rhinitis .  Diagnoses and all orders for this visit:  Need for influenza vaccination -     Flu Vaccine QUAD 36+ mos IM  Gastroesophageal reflux disease with esophagitis- her symptoms are well controlled with a PPI -     CBC with Differential/Platelet; Future  Patellar tendinitis, unspecified laterality- will continue the NSAID as needed -     naproxen (NAPROSYN) 375 MG tablet; TAKE 1 TABLET EVERY DAY AS NEEDED FOR PAIN  Acne, unspecified acne type -     EPIDUO 0.1-2.5 % gel; APPLY A THIN LAYER ONCE NIGHTLY TO THE FACE, CHEST, AND BACK  Patellar tendinitis of left knee  PCOS (polycystic ovarian syndrome)- she is doing well on the oral contraceptive -     norgestimate-ethinyl estradiol (ORTHO-CYCLEN,SPRINTEC,PREVIFEM) 0.25-35 MG-MCG tablet; Take 1 tablet by mouth daily. -     Comprehensive metabolic panel; Future  Routine general medical examination at a health care facility- exam completed, labs reviewed, she defers on a Pap smear since she has never been sexually active, patient education material was given. -     Lipid panel; Future  Seasonal allergic rhinitis due to pollen- she is not willing to do a nasal spray so will start an oral antihistamine. -     levocetirizine (XYZAL) 5 MG tablet; Take 1 tablet (5 mg total) by mouth every evening.   I am having Ms. Lamorte start on levocetirizine. I am also having her maintain her omeprazole, EPIDUO,  naproxen, and norgestimate-ethinyl estradiol.  Meds ordered this encounter  Medications  . EPIDUO 0.1-2.5 % gel    Sig: APPLY A THIN LAYER ONCE NIGHTLY TO THE FACE, CHEST, AND BACK    Dispense:  45 g    Refill:  5  . naproxen (NAPROSYN) 375 MG tablet    Sig: TAKE 1 TABLET EVERY DAY AS NEEDED FOR PAIN    Dispense:  90 tablet    Refill:  1  . norgestimate-ethinyl estradiol (ORTHO-CYCLEN,SPRINTEC,PREVIFEM) 0.25-35 MG-MCG tablet    Sig: Take 1 tablet by mouth daily.    Dispense:  1 Package    Refill:  11  . levocetirizine (XYZAL) 5 MG tablet    Sig: Take 1 tablet (5 mg total) by mouth every evening.    Dispense:  90 tablet    Refill:  1     Follow-up: Return if symptoms worsen or fail to improve.  Sanda Linger, MD

## 2016-11-27 NOTE — Patient Instructions (Signed)
Preventive Care 18-39 Years, Female Preventive care refers to lifestyle choices and visits with your health care provider that can promote health and wellness. What does preventive care include?  A yearly physical exam. This is also called an annual well check.  Dental exams once or twice a year.  Routine eye exams. Ask your health care provider how often you should have your eyes checked.  Personal lifestyle choices, including: ? Daily care of your teeth and gums. ? Regular physical activity. ? Eating a healthy diet. ? Avoiding tobacco and drug use. ? Limiting alcohol use. ? Practicing safe sex. ? Taking vitamin and mineral supplements as recommended by your health care provider. What happens during an annual well check? The services and screenings done by your health care provider during your annual well check will depend on your age, overall health, lifestyle risk factors, and family history of disease. Counseling Your health care provider may ask you questions about your:  Alcohol use.  Tobacco use.  Drug use.  Emotional well-being.  Home and relationship well-being.  Sexual activity.  Eating habits.  Work and work Statistician.  Method of birth control.  Menstrual cycle.  Pregnancy history.  Screening You may have the following tests or measurements:  Height, weight, and BMI.  Diabetes screening. This is done by checking your blood sugar (glucose) after you have not eaten for a while (fasting).  Blood pressure.  Lipid and cholesterol levels. These may be checked every 5 years starting at age 66.  Skin check.  Hepatitis C blood test.  Hepatitis B blood test.  Sexually transmitted disease (STD) testing.  BRCA-related cancer screening. This may be done if you have a family history of breast, ovarian, tubal, or peritoneal cancers.  Pelvic exam and Pap test. This may be done every 3 years starting at age 40. Starting at age 59, this may be done every 5  years if you have a Pap test in combination with an HPV test.  Discuss your test results, treatment options, and if necessary, the need for more tests with your health care provider. Vaccines Your health care provider may recommend certain vaccines, such as:  Influenza vaccine. This is recommended every year.  Tetanus, diphtheria, and acellular pertussis (Tdap, Td) vaccine. You may need a Td booster every 10 years.  Varicella vaccine. You may need this if you have not been vaccinated.  HPV vaccine. If you are 69 or younger, you may need three doses over 6 months.  Measles, mumps, and rubella (MMR) vaccine. You may need at least one dose of MMR. You may also need a second dose.  Pneumococcal 13-valent conjugate (PCV13) vaccine. You may need this if you have certain conditions and were not previously vaccinated.  Pneumococcal polysaccharide (PPSV23) vaccine. You may need one or two doses if you smoke cigarettes or if you have certain conditions.  Meningococcal vaccine. One dose is recommended if you are age 21-21 years and a first-year college student living in a residence hall, or if you have one of several medical conditions. You may also need additional booster doses.  Hepatitis A vaccine. You may need this if you have certain conditions or if you travel or work in places where you may be exposed to hepatitis A.  Hepatitis B vaccine. You may need this if you have certain conditions or if you travel or work in places where you may be exposed to hepatitis B.  Haemophilus influenzae type b (Hib) vaccine. You may need this if  you have certain risk factors.  Talk to your health care provider about which screenings and vaccines you need and how often you need them. This information is not intended to replace advice given to you by your health care provider. Make sure you discuss any questions you have with your health care provider. Document Released: 03/28/2001 Document Revised: 10/20/2015  Document Reviewed: 12/01/2014 Elsevier Interactive Patient Education  2017 [employer].

## 2016-11-29 ENCOUNTER — Encounter: Payer: Self-pay | Admitting: Internal Medicine

## 2017-03-07 ENCOUNTER — Ambulatory Visit: Payer: BLUE CROSS/BLUE SHIELD | Admitting: Internal Medicine

## 2017-04-25 ENCOUNTER — Other Ambulatory Visit: Payer: Self-pay | Admitting: Internal Medicine

## 2017-04-25 DIAGNOSIS — K219 Gastro-esophageal reflux disease without esophagitis: Secondary | ICD-10-CM

## 2017-05-06 ENCOUNTER — Other Ambulatory Visit: Payer: Self-pay | Admitting: Internal Medicine

## 2017-05-06 DIAGNOSIS — L700 Acne vulgaris: Secondary | ICD-10-CM

## 2017-05-06 DIAGNOSIS — L709 Acne, unspecified: Secondary | ICD-10-CM

## 2017-05-06 MED ORDER — ADAPALENE-BENZOYL PEROXIDE 0.1-2.5 % EX GEL: CUTANEOUS | 5 refills | Status: DC

## 2017-07-23 ENCOUNTER — Other Ambulatory Visit: Payer: Self-pay | Admitting: Internal Medicine

## 2017-07-23 DIAGNOSIS — M7650 Patellar tendinitis, unspecified knee: Secondary | ICD-10-CM

## 2017-08-01 ENCOUNTER — Telehealth: Payer: Self-pay | Admitting: Internal Medicine

## 2017-08-01 NOTE — Telephone Encounter (Signed)
Copied from CRM [id number]. Topic: Referral - Request >> Aug 01, 2017 10:32 AM [name], [name], NT wrote: Reason for CRM: pt. Calling to request referrals for a nutritionist and psychiatrist

## 2017-10-10 ENCOUNTER — Encounter: Payer: Self-pay | Admitting: Family

## 2017-10-10 ENCOUNTER — Other Ambulatory Visit (INDEPENDENT_AMBULATORY_CARE_PROVIDER_SITE_OTHER): Payer: BLUE CROSS/BLUE SHIELD

## 2017-10-10 ENCOUNTER — Ambulatory Visit: Payer: BLUE CROSS/BLUE SHIELD | Admitting: Family

## 2017-10-10 VITALS — BP 116/78 | HR 112 | Temp 98.2°F | Ht 65.0 in | Wt 169.0 lb

## 2017-10-10 DIAGNOSIS — K219 Gastro-esophageal reflux disease without esophagitis: Secondary | ICD-10-CM | POA: Diagnosis not present

## 2017-10-10 DIAGNOSIS — L709 Acne, unspecified: Secondary | ICD-10-CM

## 2017-10-10 DIAGNOSIS — L700 Acne vulgaris: Secondary | ICD-10-CM

## 2017-10-10 DIAGNOSIS — E282 Polycystic ovarian syndrome: Secondary | ICD-10-CM | POA: Diagnosis not present

## 2017-10-10 DIAGNOSIS — J301 Allergic rhinitis due to pollen: Secondary | ICD-10-CM | POA: Diagnosis not present

## 2017-10-10 DIAGNOSIS — Z23 Encounter for immunization: Secondary | ICD-10-CM | POA: Diagnosis not present

## 2017-10-10 DIAGNOSIS — M7650 Patellar tendinitis, unspecified knee: Secondary | ICD-10-CM

## 2017-10-10 LAB — CBC WITH DIFFERENTIAL/PLATELET
BASOS ABS: 0 10*3/uL (ref 0.0–0.1)
Basophils Relative: 0.5 % (ref 0.0–3.0)
EOS ABS: 0.1 10*3/uL (ref 0.0–0.7)
Eosinophils Relative: 1.1 % (ref 0.0–5.0)
HCT: 37.8 % (ref 36.0–46.0)
Hemoglobin: 12.4 g/dL (ref 12.0–15.0)
LYMPHS ABS: 2.4 10*3/uL (ref 0.7–4.0)
Lymphocytes Relative: 45.8 % (ref 12.0–46.0)
MCHC: 32.8 g/dL (ref 30.0–36.0)
MCV: 78.8 fl (ref 78.0–100.0)
Monocytes Absolute: 0.4 10*3/uL (ref 0.1–1.0)
Monocytes Relative: 8 % (ref 3.0–12.0)
NEUTROS ABS: 2.4 10*3/uL (ref 1.4–7.7)
NEUTROS PCT: 44.6 % (ref 43.0–77.0)
PLATELETS: 262 10*3/uL (ref 150.0–400.0)
RBC: 4.79 Mil/uL (ref 3.87–5.11)
RDW: 12.9 % (ref 11.5–15.5)
WBC: 5.3 10*3/uL (ref 4.0–10.5)

## 2017-10-10 LAB — COMPREHENSIVE METABOLIC PANEL
ALK PHOS: 53 U/L (ref 39–117)
ALT: 11 U/L (ref 0–35)
AST: 11 U/L (ref 0–37)
Albumin: 3.7 g/dL (ref 3.5–5.2)
BILIRUBIN TOTAL: 0.3 mg/dL (ref 0.2–1.2)
BUN: 8 mg/dL (ref 6–23)
CO2: 26 meq/L (ref 19–32)
Calcium: 9.2 mg/dL (ref 8.4–10.5)
Chloride: 103 mEq/L (ref 96–112)
Creatinine, Ser: 0.67 mg/dL (ref 0.40–1.20)
GFR: 140.8 mL/min (ref >60.00)
GLUCOSE: 92 mg/dL (ref 70–99)
Potassium: 4 mEq/L (ref 3.5–5.1)
Sodium: 136 mEq/L (ref 135–145)
TOTAL PROTEIN: 7.5 g/dL (ref 6.0–8.3)

## 2017-10-10 LAB — MAGNESIUM: MAGNESIUM: 1.8 mg/dL (ref 1.5–2.5)

## 2017-10-10 MED ORDER — CETIRIZINE HCL 10 MG PO TABS: 10.0000 mg | ORAL_TABLET | Freq: Every day | ORAL | 3 refills | Status: DC

## 2017-10-10 MED ORDER — NORGESTIMATE-ETH ESTRADIOL 0.25-35 MG-MCG PO TABS: 1.0000 | ORAL_TABLET | Freq: Every day | ORAL | 3 refills | Status: DC

## 2017-10-10 MED ORDER — OMEPRAZOLE 20 MG PO CPDR: 20.0000 mg | DELAYED_RELEASE_CAPSULE | Freq: Every day | ORAL | 1 refills | Status: DC

## 2017-10-10 MED ORDER — NAPROXEN 375 MG PO TABS: ORAL_TABLET | ORAL | 1 refills | Status: DC

## 2017-10-10 MED ORDER — ADAPALENE-BENZOYL PEROXIDE 0.1-2.5 % EX GEL: CUTANEOUS | 5 refills | Status: DC

## 2017-10-10 NOTE — Progress Notes (Signed)
Amanda Harper is a 22 y.o. female with the following history as recorded in EpicCare:  Patient Active Problem List   Diagnosis Date Noted  . Acne 11/27/2016  . Allergic rhinitis 11/27/2016  . Noise-induced hearing loss of both ears 11/15/2015  . Patellar tendinitis 10/25/2015  . GERD (gastroesophageal reflux disease) 06/08/2014  . Osgood-Schlatter's disease of left knee 06/08/2014  . PCOS (polycystic ovarian syndrome) 06/08/2014  . Routine general medical examination at a health care facility 06/08/2014  . Helicobacter pylori gastritis 07/11/2011    Current Outpatient Medications  Medication Sig Dispense Refill  . Adapalene-Benzoyl Peroxide (EPIDUO) 0.1-2.5 % gel APPLY A THIN LAYER ONCE NIGHTLY TO THE FACE, CHEST, AND BACK 45 g 5  . levocetirizine (XYZAL) 5 MG tablet Take 1 tablet (5 mg total) by mouth every evening. 90 tablet 1  . naproxen (NAPROSYN) 375 MG tablet Take one per day as directed 90 tablet 1  . norgestimate-ethinyl estradiol (ORTHO-CYCLEN,SPRINTEC,PREVIFEM) 0.25-35 MG-MCG tablet Take 1 tablet by mouth daily. 3 Package 3  . omeprazole (PRILOSEC) 20 MG capsule Take 1 capsule (20 mg total) by mouth daily. 90 capsule 1  . cetirizine (ZYRTEC) 10 MG tablet Take 1 tablet (10 mg total) by mouth daily. 90 tablet 3   No current facility-administered medications for this visit.     Allergies: Other  Past Medical History:  Diagnosis Date  . GERD (gastroesophageal reflux disease)   . Osgood-Schlatter's disease of left knee   . PCOS (polycystic ovarian syndrome)     History reviewed. No pertinent surgical history.  Family History  Problem Relation Age of Onset  . Hypertension Father   . Hypertension Sister   . Hypertension Maternal Grandmother   . Hypertension Maternal Grandfather   . Diabetes Maternal Grandfather   . Hyperlipidemia Paternal Grandmother   . Cancer Neg Hx   . Alcohol abuse Neg Hx   . Drug abuse Neg Hx   . Early death Neg Hx   . Heart disease Neg Hx   .  Kidney disease Neg Hx     Social History   Tobacco Use  . Smoking status: Never Smoker  . Smokeless tobacco: Never Used  Substance Use Topics  . Alcohol use: No    Subjective:  Presents with her mother today; seen to follow-up on chronic care needs including:  1) Acne; 2) GERD; 3) Chronic left knee pain; 4) allergic rhinitis; 5) PCOS- has been having recurrent right sided pelvic pain   Objective:  Vitals:   10/10/17 1400  BP: 116/78  Pulse: (!) 112  Temp: 98.2 F (36.8 C)  TempSrc: Oral  SpO2: 99%  Weight: 169 lb (76.7 kg)  Height: '5\' 5"'$  (1.651 m)    General: Well developed, well nourished, in no acute distress  Skin : Warm and dry.  Head: Normocephalic and atraumatic  Eyes: Sclera and conjunctiva clear; pupils round and reactive to light; extraocular movements intact  Ears: External normal; canals clear; tympanic membranes normal  Oropharynx: Pink, supple. No suspicious lesions  Neck: Supple without thyromegaly, adenopathy  Lungs: Respirations unlabored; clear to auscultation bilaterally without wheeze, rales, rhonchi  CVS exam: normal rate and regular rhythm.  Abdomen: Soft; nontender; nondistended; normoactive bowel sounds; no masses or hepatosplenomegaly  Musculoskeletal: No deformities; no active joint inflammation  Extremities: No edema, cyanosis, clubbing  Vessels: Symmetric bilaterally  Neurologic: Alert and oriented; speech intact; face symmetrical; moves all extremities well; CNII-XII intact without focal deficit  Assessment:  1. Gastroesophageal reflux disease, esophagitis presence  not specified   2. Acne, unspecified acne type   3. Acne vulgaris   4. Seasonal allergic rhinitis due to pollen   5. Patellar tendinitis, unspecified laterality   6. PCOS (polycystic ovarian syndrome)   7. Gastroesophageal reflux disease without esophagitis   8. Need for influenza vaccination     Plan:  1. Check magnesium; refill on Prilosec; refer to GI- concern for severity  of symptoms at such a young age; ? Need for endoscopy; 2. Stable; refill updated; 4. Rx for Zyrtec 10 mg daily; 5. Refer to Dr. Tamala Julian- concern for need for daily Naproxen; 6. Update Rx for OCP, update pelvic ultrasound; patient virgin- pap smear deferred at this time;  Return for Dr. Tamala Julian left knee pain.  Orders Placed This Encounter  Procedures  . US Pelvis Complete    Standing Status:   Future    Standing Expiration Date:   12/11/2018    Order Specific Question:   Reason for Exam (SYMPTOM  OR DIAGNOSIS REQUIRED)    Answer:   right sided pelvic pain    Order Specific Question:   Preferred imaging location?    Answer:   [facility]  . Flu Vaccine QUAD 36+ mos IM  . CBC w/Diff    Standing Status:   Future    Number of Occurrences:   1    Standing Expiration Date:   10/10/2018  . Comp Met (CMET)    Standing Status:   Future    Number of Occurrences:   1    Standing Expiration Date:   10/10/2018  . Magnesium    Standing Status:   Future    Number of Occurrences:   1    Standing Expiration Date:   10/10/2018  . Ambulatory referral to Gastroenterology    Referral Priority:   Routine    Referral Type:   Consultation    Referral Reason:   Specialty Services Required    Number of Visits Requested:   1    Requested Prescriptions   Signed Prescriptions Disp Refills  . Adapalene-Benzoyl Peroxide (EPIDUO) 0.1-2.5 % gel 45 g 5    Sig: APPLY A THIN LAYER ONCE NIGHTLY TO THE FACE, CHEST, AND BACK  . cetirizine (ZYRTEC) 10 MG tablet 90 tablet 3    Sig: Take 1 tablet (10 mg total) by mouth daily.  . naproxen (NAPROSYN) 375 MG tablet 90 tablet 1    Sig: Take one per day as directed  . norgestimate-ethinyl estradiol (ORTHO-CYCLEN,SPRINTEC,PREVIFEM) 0.25-35 MG-MCG tablet 3 Package 3    Sig: Take 1 tablet by mouth daily.  Marland Kitchen omeprazole (PRILOSEC) 20 MG capsule 90 capsule 1    Sig: Take 1 capsule (20 mg total) by mouth daily.

## 2017-10-24 ENCOUNTER — Ambulatory Visit
Admission: RE | Admit: 2017-10-24 | Discharge: 2017-10-24 | Disposition: A | Discharge status: Home / Self Care | Payer: BLUE CROSS/BLUE SHIELD | Source: Ambulatory Visit | Attending: Family | Admitting: Family

## 2017-10-24 ENCOUNTER — Other Ambulatory Visit: Payer: Self-pay | Admitting: Family

## 2017-10-24 DIAGNOSIS — E282 Polycystic ovarian syndrome: Secondary | ICD-10-CM

## 2017-10-29 ENCOUNTER — Ambulatory Visit: Payer: BLUE CROSS/BLUE SHIELD | Admitting: Family

## 2017-11-04 NOTE — Progress Notes (Signed)
Tawana ScaleZach Andree Golphin D.O. [facility] [address] [place], [place] [address] Phone: [phone] Subjective:   Bruce Donath, Valerie Wolf, am serving as a scribe for Dr. Antoine PrimasZachary Nehemie Casserly.  I'm seeing this patient by the request  of:  Olive BassMurray, Laura Woodruff, FNP   CC: Left knee pain  BJY:NWGNFAOZHYHPI:Subjective  Amanda Harper is a 22 y.o. female coming in with complaint of left knee pain. Has had pain for 9 years. Pain occurs on anterior lateral Has radiating pain up and down lateral aspect of leg. Constant dull ache that can increase to sharp pain. Pain increases with prolonged sitting and lying down. Takes naproxen, ices. Extending her knee alleviates her pain.       Past Medical History:  Diagnosis Date  . GERD (gastroesophageal reflux disease)   . Osgood-Schlatter's disease of left knee   . PCOS (polycystic ovarian syndrome)    No past surgical history on file. Social History   Socioeconomic History  . Marital status: Single    Spouse name: Not on file  . Number of children: Not on file  . Years of education: Not on file  . Highest education level: Not on file  Occupational History  . Not on file  Social Needs  . Financial resource strain: Not on file  . Food insecurity:    Worry: Not on file    Inability: Not on file  . Transportation needs:    Medical: Not on file    Non-medical: Not on file  Tobacco Use  . Smoking status: Never Smoker  . Smokeless tobacco: Never Used  Substance and Sexual Activity  . Alcohol use: No  . Drug use: No  . Sexual activity: Never    Birth control/protection: Abstinence  Lifestyle  . Physical activity:    Days per week: Not on file    Minutes per session: Not on file  . Stress: Not on file  Relationships  . Social connections:    Talks on phone: Not on file    Gets together: Not on file    Attends religious service: Not on file    Active member of club or organization: Not on file    Attends meetings of clubs or organizations: Not on file   Relationship status: Not on file  Other Topics Concern  . Not on file  Social History Narrative  . Not on file   Allergies  Allergen Reactions  . Other Rash    Henna dye   Family History  Problem Relation Age of Onset  . Hypertension Father   . Hypertension Sister   . Hypertension Maternal Grandmother   . Hypertension Maternal Grandfather   . Diabetes Maternal Grandfather   . Hyperlipidemia Paternal Grandmother   . Cancer Neg Hx   . Alcohol abuse Neg Hx   . Drug abuse Neg Hx   . Early death Neg Hx   . Heart disease Neg Hx   . Kidney disease Neg Hx     Current Outpatient Medications (Endocrine & Metabolic):  .  norgestimate-ethinyl estradiol (ORTHO-CYCLEN,SPRINTEC,PREVIFEM) 0.25-35 MG-MCG tablet, Take 1 tablet by mouth daily.   Current Outpatient Medications (Respiratory):  .  cetirizine (ZYRTEC) 10 MG tablet, Take 1 tablet (10 mg total) by mouth daily. Marland Kitchen.  levocetirizine (XYZAL) 5 MG tablet, Take 1 tablet (5 mg total) by mouth every evening.  Current Outpatient Medications (Analgesics):  .  naproxen (NAPROSYN) 375 MG tablet, Take one per day as directed   Current Outpatient Medications (Other):  .  Adapalene-Benzoyl Peroxide (EPIDUO) 0.1-2.5 % gel, APPLY A THIN LAYER ONCE NIGHTLY TO THE FACE, CHEST, AND BACK .  omeprazole (PRILOSEC) 20 MG capsule, Take 1 capsule (20 mg total) by mouth daily.    Past medical history, social, surgical and family history all reviewed in electronic medical record.  No pertanent information unless stated regarding to the chief complaint.   Review of Systems:  No headache, visual changes, nausea, vomiting, diarrhea, constipation, dizziness, abdominal pain, skin rash, fevers, chills, night sweats, weight loss, swollen lymph nodes, body aches, joint swelling, muscle aches, chest pain, shortness of breath, mood changes.   Objective  Blood pressure 120/72, pulse 98, height 5\' 5"  (1.651 m), weight 173 lb (78.5 kg), SpO2 98 %.    General: No  apparent distress alert and oriented x3 mood and affect normal, dressed appropriately.  HEENT: Pupils equal, extraocular movements intact  Respiratory: Patient's speak in full sentences and does not appear short of breath  Cardiovascular: No lower extremity edema, non tender, no erythema  Skin: Warm dry intact with no signs of infection or rash on extremities or on axial skeleton.  Abdomen: Soft nontender  Neuro: Cranial nerves II through XII are intact, neurovascularly intact in all extremities with 2+ DTRs and 2+ pulses.  Lymph: No lymphadenopathy of posterior or anterior cervical chain or axillae bilaterally.  Gait normal with good balance and coordination.  MSK:  Non tender with full range of motion and good stability and symmetric strength and tone of shoulders, elbows, wrist, hip and ankles bilaterally.  Knee: Left Normal to inspection with no erythema or effusion or obvious bony abnormalities.  Patella alta noted Tender to palpation over the inferior aspect of the patella ROM full in flexion and extension and lower leg rotation. Ligaments with solid consistent endpoints including ACL, PCL, LCL, MCL. Negative Mcmurray's, Apley's, and Thessalonian tests. Mild painful patellar compression. Patellar glide with mild crepitus. Patellar and quadriceps tendons unremarkable. Hamstring and quadriceps strength is normal. Contralateral knee unremarkable  MSK US performed of: Left knee This study was ordered, performed, and interpreted by Terrilee Files D.O.  Knee: All structures visualized. Anteromedial, anterolateral, posteromedial, and posterolateral menisci unremarkable without tearing, fraying, effusion, or displacement. Patellar Tendon very mild hypoechoic changes at the insertion but no increase in Doppler flow.  No intrasubstance tearing noted. No abnormality of prepatellar bursa. LCL and MCL unremarkable on long and transverse views. No abnormality of origin of medial or lateral head of  the gastrocnemius.  IMPRESSION: Patella alta with minimal tendinitis   Impression and Recommendations:     This case required medical decision making of moderate complexity. The above documentation has been reviewed and is accurate and complete Judi Saa, DO       Note: This dictation was prepared with Dragon dictation along with smaller phrase technology. Any transcriptional errors that result from this process are unintentional.

## 2017-11-05 ENCOUNTER — Encounter: Payer: Self-pay | Admitting: Family Medicine

## 2017-11-05 ENCOUNTER — Ambulatory Visit: Payer: Self-pay

## 2017-11-05 ENCOUNTER — Ambulatory Visit: Payer: BLUE CROSS/BLUE SHIELD | Admitting: Family Medicine

## 2017-11-05 VITALS — BP 120/72 | HR 98 | Ht 65.0 in | Wt 173.0 lb

## 2017-11-05 DIAGNOSIS — M7650 Patellar tendinitis, unspecified knee: Secondary | ICD-10-CM

## 2017-11-05 DIAGNOSIS — G8929 Other chronic pain: Secondary | ICD-10-CM

## 2017-11-05 DIAGNOSIS — Q682 Congenital deformity of knee: Secondary | ICD-10-CM | POA: Insufficient documentation

## 2017-11-05 DIAGNOSIS — M25562 Pain in left knee: Secondary | ICD-10-CM | POA: Diagnosis not present

## 2017-11-05 HISTORY — DX: Congenital deformity of knee: Q68.2

## 2017-11-05 NOTE — Assessment & Plan Note (Signed)
Patient has morbid patella alta, discussed bracing, home exercises and work with Event organiserathletic trainer, discussed topical anti-inflammatories and home exercises.  Discussed which activities to do which wants to avoid.  Patient will follow-up with me again in 4 to 6 weeks

## 2017-11-05 NOTE — Patient Instructions (Addendum)
Good to see you  Ice is your friend Ice 20 minutes 2 times daily. Usually after activity and before bed. Patella strap with a lot of walking and working out.  Good shoes with rigid bottom.  Dierdre HarnessKeen, Dansko, Merrell or New balance greater then 700 Vitamin D 2000 Iu daily  Exercises 3 times a week.  See me again in 4-6 weeks to make sure better

## 2017-11-26 ENCOUNTER — Encounter: Payer: Self-pay | Admitting: Family

## 2017-12-01 NOTE — Progress Notes (Deleted)
Amanda Harper [address] [place], [place] [address] Phone: [phone] Subjective:    I'm seeing this patient by the request  of:    CC:   BJY:[id number]  Amanda Harper is a 22 y.o. female coming in with complaint of ***  Onset-  Location Duration-  Character- Aggravating factors- Reliving factors-  Therapies tried-  Severity-     Past Medical History:  Diagnosis Date  . GERD (gastroesophageal reflux disease)   . Osgood-Schlatter's disease of left knee   . PCOS (polycystic ovarian syndrome)    No past surgical history on file. Social History   Socioeconomic History  . Marital status: Single    Spouse name: Not on file  . Number of children: Not on file  . Years of education: Not on file  . Highest education level: Not on file  Occupational History  . Not on file  Social Needs  . Financial resource strain: Not on file  . Food insecurity:    Worry: Not on file    Inability: Not on file  . Transportation needs:    Medical: Not on file    Non-medical: Not on file  Tobacco Use  . Smoking status: Never Smoker  . Smokeless tobacco: Never Used  Substance and Sexual Activity  . Alcohol use: No  . Drug use: No  . Sexual activity: Never    Birth control/protection: Abstinence  Lifestyle  . Physical activity:    Days per week: Not on file    Minutes per session: Not on file  . Stress: Not on file  Relationships  . Social connections:    Talks on phone: Not on file    Gets together: Not on file    Attends religious service: Not on file    Active member of club or organization: Not on file    Attends meetings of clubs or organizations: Not on file    Relationship status: Not on file  Other Topics Concern  . Not on file  Social History Narrative  . Not on file   Allergies  Allergen Reactions  . Other Rash    Henna dye   Family History  Problem Relation Age of Onset  . Hypertension Father   . Hypertension Sister   .  Hypertension Maternal Grandmother   . Hypertension Maternal Grandfather   . Diabetes Maternal Grandfather   . Hyperlipidemia Paternal Grandmother   . Cancer Neg Hx   . Alcohol abuse Neg Hx   . Drug abuse Neg Hx   . Early death Neg Hx   . Heart disease Neg Hx   . Kidney disease Neg Hx     Current Outpatient Medications (Endocrine & Metabolic):  .  norgestimate-ethinyl estradiol (ORTHO-CYCLEN,SPRINTEC,PREVIFEM) 0.25-35 MG-MCG tablet, Take 1 tablet by mouth daily.   Current Outpatient Medications (Respiratory):  .  cetirizine (ZYRTEC) 10 MG tablet, Take 1 tablet (10 mg total) by mouth daily. Marland Kitchen  levocetirizine (XYZAL) 5 MG tablet, Take 1 tablet (5 mg total) by mouth every evening.  Current Outpatient Medications (Analgesics):  .  naproxen (NAPROSYN) 375 MG tablet, Take one per day as directed   Current Outpatient Medications (Other):  Marland Kitchen  Adapalene-Benzoyl Peroxide (EPIDUO) 0.1-2.5 % gel, APPLY A THIN LAYER ONCE NIGHTLY TO THE FACE, CHEST, AND BACK .  omeprazole (PRILOSEC) 20 MG capsule, Take 1 capsule (20 mg total) by mouth daily.    Past medical history, social, surgical and family history all reviewed in electronic  medical record.  No pertanent information unless stated regarding to the chief complaint.   Review of Systems:  No headache, visual changes, nausea, vomiting, diarrhea, constipation, dizziness, abdominal pain, skin rash, fevers, chills, night sweats, weight loss, swollen lymph nodes, body aches, joint swelling, muscle aches, chest pain, shortness of breath, mood changes.   Objective  There were no vitals taken for this visit. Systems examined below as of    General: No apparent distress alert and oriented x3 mood and affect normal, dressed appropriately.  HEENT: Pupils equal, extraocular movements intact  Respiratory: Patient's speak in full sentences and does not appear short of breath  Cardiovascular: No lower extremity edema, non tender, no erythema  Skin: Warm  dry intact with no signs of infection or rash on extremities or on axial skeleton.  Abdomen: Soft nontender  Neuro: Cranial nerves II through XII are intact, neurovascularly intact in all extremities with 2+ DTRs and 2+ pulses.  Lymph: No lymphadenopathy of posterior or anterior cervical chain or axillae bilaterally.  Gait normal with good balance and coordination.  MSK:  Non tender with full range of motion and good stability and symmetric strength and tone of shoulders, elbows, wrist, hip, knee and ankles bilaterally.     Impression and Recommendations:     This case required medical decision making of moderate complexity. The above documentation has been reviewed and is accurate and complete Amanda Saa, DO       Note: This dictation was prepared with Dragon dictation along with smaller phrase technology. Any transcriptional errors that result from this process are unintentional.

## 2017-12-03 ENCOUNTER — Ambulatory Visit: Payer: BLUE CROSS/BLUE SHIELD | Admitting: Family Medicine

## 2017-12-03 DIAGNOSIS — Z0289 Encounter for other administrative examinations: Secondary | ICD-10-CM

## 2017-12-13 ENCOUNTER — Ambulatory Visit: Payer: BLUE CROSS/BLUE SHIELD | Admitting: Family Medicine

## 2017-12-13 ENCOUNTER — Ambulatory Visit: Payer: Self-pay

## 2017-12-13 ENCOUNTER — Encounter: Payer: Self-pay | Admitting: Family Medicine

## 2017-12-13 VITALS — BP 136/86 | HR 99 | Temp 98.7°F | Ht 65.0 in | Wt 174.0 lb

## 2017-12-13 DIAGNOSIS — R002 Palpitations: Secondary | ICD-10-CM

## 2017-12-13 NOTE — Telephone Encounter (Signed)
Pt. Reports that for 2 weeks she has had elevated heart rate at night only. "I can eventually go to sleep. Last night it was really bad though - I didn't sleep all night." Denies any chest pain or shortness of breath. Heart rate now is 80. Thinks this is related to smoking cigarettes. Has quit, but smoked hookah last night. Appointment made for this morning. No availability with Ms. Dayton Scrape.   Reason for Disposition . [1] Palpitations AND [2] no improvement after using CARE ADVICE  Answer Assessment - Initial Assessment Questions 1. DESCRIPTION: "Please describe your heart rate or heart beat that you are having" (e.g., fast/slow, regular/irregular, skipped or extra beats, "palpitations")     Fast heart rate only at night x 2 weeks 2. ONSET: "When did it start?" (Minutes, hours or days)      2 weeks ago 3. DURATION: "How long does it last" (e.g., seconds, minutes, hours)     Lasted all night last night 4. PATTERN "Does it come and go, or has it been constant since it started?"  "Does it get worse with exertion?"   "Are you feeling it now?"     Feels nauseated - heart rate is normal now 5. TAP: "Using your hand, can you tap out what you are feeling on a chair or table in front of you, so that I can hear?" (Note: not all patients can do this)       No 6. HEART RATE: "Can you tell me your heart rate?" "How many beats in 15 seconds?"  (Note: not all patients can do this)       80 7. RECURRENT SYMPTOM: "Have you ever had this before?" If so, ask: "When was the last time?" and "What happened that time?"      No 8. CAUSE: "What do you think is causing the palpitations?"     Smoking cigarettes  9. CARDIAC HISTORY: "Do you have any history of heart disease?" (e.g., heart attack, angina, bypass surgery, angioplasty, arrhythmia)      No 10. OTHER SYMPTOMS: "Do you have any other symptoms?" (e.g., dizziness, chest pain, sweating, difficulty breathing)       No 11. PREGNANCY: "Is there any chance you are  pregnant?" "When was your last menstrual period?"       On her period now  Protocols used: HEART RATE AND HEARTBEAT QUESTIONS-A-AH

## 2017-12-13 NOTE — Patient Instructions (Addendum)
Please consider avoiding nicotine through vaping and hookah.  Increase water intake and minimize caffeine intake. Follow up promptly for any increase in symptoms.      Palpitations A palpitation is the feeling that your heart:  Has an uneven (irregular) heartbeat.  Is beating faster than normal.  Is fluttering.  Is skipping a beat.  This is usually not a serious problem. In some cases, you may need more medical tests. Follow these instructions at home:  Avoid: ? Caffeine in coffee, tea, soft drinks, diet pills, and energy drinks. ? Chocolate. ? Alcohol.  Do not use any tobacco products. These include cigarettes, chewing tobacco, and e-cigarettes. If you need help quitting, ask your doctor.  Try to reduce your stress. These things may help: ? Yoga. ? Meditation. ? Physical activity. Swimming, jogging, and walking are good choices. ? A method that helps you use your mind to control things in your body, like heartbeats (biofeedback).  Get plenty of rest and sleep.  Take over-the-counter and prescription medicines only as told by your doctor.  Keep all follow-up visits as told by your doctor. This is important. Contact a doctor if:  Your heartbeat is still fast or uneven after 24 hours.  Your palpitations occur more often. Get help right away if:  You have chest pain.  You feel short of breath.  You have a very bad headache.  You feel dizzy.  You pass out (faint). This information is not intended to replace advice given to you by your health care provider. Make sure you discuss any questions you have with your health care provider. Document Released: 11/09/2007 Document Revised: 07/08/2015 Document Reviewed: 10/15/2014 Elsevier Interactive Patient Education  [employer].

## 2017-12-13 NOTE — Progress Notes (Signed)
Subjective:    Patient ID: Amanda Harper, female    DOB: 27-Jul-1995, 22 y.o.   MRN: [id number]  HPI  Amanda Harper is a 22 year old female who presents with elevated heart rate in the evening that has been present for approximately one month. This is noticed at night and after use of nicotine while vaping. She denies chest pain, SOB, NV, diaphoresis, arm pain, jaw pain, fatigue, lightheadedness. No evidence of palpitations or chest pain with activity.  She reports that this has been associated with vaping and now smoking Hookah. She states that when she stops vaping then symptoms resolve. She states that she has used "high amount" of nicotine when she vaped choosing the highest nicotine content  and thought that use of Hookah would be better. She noticed symptoms with both.   She reports drinking monster energy drinks daily in lieu of water.    Review of Systems  Constitutional: Negative for chills, fatigue and fever.  Respiratory: Negative for cough, shortness of breath and wheezing.   Cardiovascular: Positive for palpitations. Negative for chest pain.  Gastrointestinal: Negative for abdominal pain, diarrhea, nausea and vomiting.  Skin: Negative for rash.  Neurological: Negative for dizziness, weakness, light-headedness and headaches.  Psychiatric/Behavioral:       Denies depressed or anxious mood   Past Medical History:  Diagnosis Date  . GERD (gastroesophageal reflux disease)   . Osgood-Schlatter's disease of left knee   . PCOS (polycystic ovarian syndrome)      Social History   Socioeconomic History  . Marital status: Single    Spouse name: Not on file  . Number of children: Not on file  . Years of education: Not on file  . Highest education level: Not on file  Occupational History  . Not on file  Social Needs  . Financial resource strain: Not on file  . Food insecurity:    Worry: Not on file    Inability: Not on file  . Transportation needs:    Medical: Not on  file    Non-medical: Not on file  Tobacco Use  . Smoking status: Never Smoker  . Smokeless tobacco: Never Used  Substance and Sexual Activity  . Alcohol use: No  . Drug use: No  . Sexual activity: Never    Birth control/protection: Abstinence  Lifestyle  . Physical activity:    Days per week: Not on file    Minutes per session: Not on file  . Stress: Not on file  Relationships  . Social connections:    Talks on phone: Not on file    Gets together: Not on file    Attends religious service: Not on file    Active member of club or organization: Not on file    Attends meetings of clubs or organizations: Not on file    Relationship status: Not on file  . Intimate partner violence:    Fear of current or ex partner: Not on file    Emotionally abused: Not on file    Physically abused: Not on file    Forced sexual activity: Not on file  Other Topics Concern  . Not on file  Social History Narrative  . Not on file    No past surgical history on file.  Family History  Problem Relation Age of Onset  . Hypertension Father   . Hypertension Sister   . Hypertension Maternal Grandmother   . Hypertension Maternal Grandfather   . Diabetes Maternal Grandfather   .  Hyperlipidemia Paternal Grandmother   . Cancer Neg Hx   . Alcohol abuse Neg Hx   . Drug abuse Neg Hx   . Early death Neg Hx   . Heart disease Neg Hx   . Kidney disease Neg Hx     Allergies  Allergen Reactions  . Other Rash    Henna dye    Current Outpatient Medications on File Prior to Visit  Medication Sig Dispense Refill  . Adapalene-Benzoyl Peroxide (EPIDUO) 0.1-2.5 % gel APPLY A THIN LAYER ONCE NIGHTLY TO THE FACE, CHEST, AND BACK 45 g 5  . cetirizine (ZYRTEC) 10 MG tablet Take 1 tablet (10 mg total) by mouth daily. 90 tablet 3  . levocetirizine (XYZAL) 5 MG tablet Take 1 tablet (5 mg total) by mouth every evening. 90 tablet 1  . naproxen (NAPROSYN) 375 MG tablet Take one per day as directed 90 tablet 1  .  norgestimate-ethinyl estradiol (ORTHO-CYCLEN,SPRINTEC,PREVIFEM) 0.25-35 MG-MCG tablet Take 1 tablet by mouth daily. 3 Package 3  . omeprazole (PRILOSEC) 20 MG capsule Take 1 capsule (20 mg total) by mouth daily. 90 capsule 1   No current facility-administered medications on file prior to visit.     BP 136/86   Pulse 99   Temp 98.7 F (37.1 C) (Oral)   Ht 5\' 5"  (1.651 m)   Wt 174 lb (78.9 kg)   SpO2 100%   BMI 28.96 kg/m       Objective:   Physical Exam  Constitutional: She is oriented to person, place, and time. She appears well-developed and well-nourished.  Eyes: Pupils are equal, round, and reactive to light. No scleral icterus.  Neck: Neck supple.  Cardiovascular: Normal rate, regular rhythm, normal heart sounds and intact distal pulses.  Pulmonary/Chest: Effort normal and breath sounds normal. She has no wheezes. She has no rales.  Abdominal: Soft. Bowel sounds are normal. There is no tenderness.  Musculoskeletal: She exhibits no edema.  Lymphadenopathy:    She has no cervical adenopathy.  Neurological: She is alert and oriented to person, place, and time.  Skin: Skin is warm and dry.  Psychiatric: She has a normal mood and affect. Her behavior is normal. Judgment and thought content normal.       Assessment & Plan:  1. Palpitations EKG and presentation is reassuring today.  EKG Interpretation Date/Time: 12/13/17   Time: 10:58 am Ventricular Rate: 73 PR Interval: 148 msec QRS Duration: 92 msec QT Interval: 364 msec Text Interpretation: NSR - EKG 12-Lead  Exam and history are most consistent with intermittent palpitations that are triggered with nicotine use. Symptoms resolve when avoiding nicotine. EKG is reassuring today. Advised patient regarding vaping/hookah cessation, minimize caffeine intake and follow up promptly for any increase in episodes or symptoms of tachycardia, dizziness, syncope. We discussed that if symptoms do not improve, a TSH can be considered  as well as event monitoring if needed.  She voiced understanding and agreed with plan.  I spent 25 minutes with this patient and greater than 50% of the time was spent in face to face counseling regarding nicotine use and cessation of vaping and hookah with nicotine that is likely contributing to symptoms. We reviewed importance of hydration and minimizing caffeine intake also.   Roddie Mc, FNP-C

## 2018-02-11 ENCOUNTER — Encounter: Payer: Self-pay | Admitting: Family Medicine

## 2018-02-11 ENCOUNTER — Ambulatory Visit: Payer: BLUE CROSS/BLUE SHIELD | Admitting: Family Medicine

## 2018-02-11 VITALS — BP 100/72 | HR 85 | Temp 99.0°F | Ht 65.0 in | Wt 175.0 lb

## 2018-02-11 DIAGNOSIS — R05 Cough: Secondary | ICD-10-CM | POA: Diagnosis not present

## 2018-02-11 DIAGNOSIS — H109 Unspecified conjunctivitis: Secondary | ICD-10-CM | POA: Insufficient documentation

## 2018-02-11 DIAGNOSIS — R059 Cough, unspecified: Secondary | ICD-10-CM | POA: Insufficient documentation

## 2018-02-11 MED ORDER — GUAIFENESIN-CODEINE 100-10 MG/5ML PO SOLN: 5.0000 mL | Freq: Three times a day (TID) | ORAL | 0 refills | Status: DC | PRN

## 2018-02-11 NOTE — Progress Notes (Signed)
Amanda Shaggyada Z Bertsch - 22 y.o. female MRN [id number]  Date of birth: 01/19/1996  SUBJECTIVE:  Including CC & ROS.  Chief Complaint  Patient presents with  . Eye Pain    pink , x1 day     Amanda Harper is a 22 y.o. female that is presenting with pinkeye and cough and congestion.  She is completed a 3-day course of antibiotics that started on Friday.  She has recently traveled back from [place].  She denies any leg swelling or leg pain.  She denies any bloody productive sputum.  She has no history of DVT.  She denies any trauma or foreign body in the eye.  She denies any vision changes.  She does have some retro-orbital pain.  Feels like she is improved in terms of the sore throat.    Review of Systems  Constitutional: Negative for fever.  HENT: Positive for congestion.   Eyes: Positive for redness.  Respiratory: Positive for cough.   Cardiovascular: Negative for chest pain.  Gastrointestinal: Negative for abdominal pain.    HISTORY: Past Medical, Surgical, Social, and Family History Reviewed & Updated per EMR.   Pertinent Historical Findings include:  Past Medical History:  Diagnosis Date  . GERD (gastroesophageal reflux disease)   . Osgood-Schlatter's disease of left knee   . PCOS (polycystic ovarian syndrome)     No past surgical history on file.  Allergies  Allergen Reactions  . Other Rash    Henna dye    Family History  Problem Relation Age of Onset  . Hypertension Father   . Hypertension Sister   . Hypertension Maternal Grandmother   . Hypertension Maternal Grandfather   . Diabetes Maternal Grandfather   . Hyperlipidemia Paternal Grandmother   . Cancer Neg Hx   . Alcohol abuse Neg Hx   . Drug abuse Neg Hx   . Early death Neg Hx   . Heart disease Neg Hx   . Kidney disease Neg Hx      Social History   Socioeconomic History  . Marital status: Single    Spouse name: Not on file  . Number of children: Not on file  . Years of education: Not on file  . Highest  education level: Not on file  Occupational History  . Not on file  Social Needs  . Financial resource strain: Not on file  . Food insecurity:    Worry: Not on file    Inability: Not on file  . Transportation needs:    Medical: Not on file    Non-medical: Not on file  Tobacco Use  . Smoking status: Never Smoker  . Smokeless tobacco: Never Used  Substance and Sexual Activity  . Alcohol use: No  . Drug use: No  . Sexual activity: Never    Birth control/protection: Abstinence  Lifestyle  . Physical activity:    Days per week: Not on file    Minutes per session: Not on file  . Stress: Not on file  Relationships  . Social connections:    Talks on phone: Not on file    Gets together: Not on file    Attends religious service: Not on file    Active member of club or organization: Not on file    Attends meetings of clubs or organizations: Not on file    Relationship status: Not on file  . Intimate partner violence:    Fear of current or ex partner: Not on file    Emotionally abused:  Not on file    Physically abused: Not on file    Forced sexual activity: Not on file  Other Topics Concern  . Not on file  Social History Narrative  . Not on file     PHYSICAL EXAM:  VS: BP 100/72 (BP Location: Right Arm, Patient Position: Sitting, Cuff Size: Normal)   Pulse 85   Temp 99 F (37.2 C) (Oral)   Ht 5\' 5"  (1.651 m)   Wt 175 lb (79.4 kg)   SpO2 99%   BMI 29.12 kg/m  Physical Exam Gen: NAD, alert, cooperative with exam, ENT: normal lips, normal nasal mucosa, Eye: normal EOM,  injected left eye, normal pupillary reflex, no foreign body observed CV:  no edema, +2 pedal pulses   Resp: no accessory muscle use, non-labored, clear to auscultation bilaterally, no crackles or wheezes Skin: no rashes, no areas of induration  Neuro: normal tone, normal sensation to touch Psych:  normal insight, alert and oriented MSK: Normal gait, normal strength     ASSESSMENT & PLAN:    Cough Likely viral in nature.  No history of blood clots.  Has just flown from [place]. -Cough medicine. -Counseled supportive care. -Counseled on warning signs and given indications to follow-up.  Conjunctivitis of right eye Likely viral in nature.  No signs of foreign body or trauma. -Counseled on supportive care. -Given indications to follow-up.

## 2018-02-11 NOTE — Assessment & Plan Note (Signed)
Likely viral in nature.  No signs of foreign body or trauma. -Counseled on supportive care. -Given indications to follow-up.

## 2018-02-11 NOTE — Patient Instructions (Signed)
Nice to meet you  Please try warm compress on your eye  Please wash your hands well  Please try things such as zyrtec-D or allegra-D which is an antihistamine and decongestant.  Please try afrin which will help with nasal congestion but use for only three days.  Please also try using a netti pot on a regular occasion. Honey, lozenges, vick's vapor rub and humidifer can help with a sore throat and cough.  Happy new Year

## 2018-02-11 NOTE — Assessment & Plan Note (Signed)
Likely viral in nature.  No history of blood clots.  Has just flown from [place]. -Cough medicine. -Counseled supportive care. -Counseled on warning signs and given indications to follow-up.

## 2018-04-15 ENCOUNTER — Other Ambulatory Visit: Payer: Self-pay | Admitting: Family

## 2018-04-15 DIAGNOSIS — K219 Gastro-esophageal reflux disease without esophagitis: Secondary | ICD-10-CM

## 2018-04-15 MED ORDER — OMEPRAZOLE 20 MG PO CPDR: 20.0000 mg | DELAYED_RELEASE_CAPSULE | Freq: Every day | ORAL | 0 refills | Status: DC

## 2018-04-17 ENCOUNTER — Other Ambulatory Visit: Payer: Self-pay | Admitting: Family

## 2018-04-17 DIAGNOSIS — M7650 Patellar tendinitis, unspecified knee: Secondary | ICD-10-CM

## 2018-04-17 MED ORDER — NAPROXEN 375 MG PO TABS: ORAL_TABLET | ORAL | 1 refills | Status: DC

## 2018-06-19 ENCOUNTER — Other Ambulatory Visit: Payer: Self-pay | Admitting: Family

## 2018-06-19 DIAGNOSIS — M7650 Patellar tendinitis, unspecified knee: Secondary | ICD-10-CM

## 2018-08-26 ENCOUNTER — Telehealth: Payer: Self-pay | Admitting: Family

## 2018-08-26 ENCOUNTER — Other Ambulatory Visit: Payer: Self-pay | Admitting: Family

## 2018-08-26 DIAGNOSIS — E282 Polycystic ovarian syndrome: Secondary | ICD-10-CM

## 2018-08-26 DIAGNOSIS — K219 Gastro-esophageal reflux disease without esophagitis: Secondary | ICD-10-CM

## 2018-08-26 MED ORDER — NORGESTIMATE-ETH ESTRADIOL 0.25-35 MG-MCG PO TABS: 1.0000 | ORAL_TABLET | Freq: Every day | ORAL | 0 refills | Status: DC

## 2018-08-26 MED ORDER — OMEPRAZOLE 20 MG PO CPDR: 20.0000 mg | DELAYED_RELEASE_CAPSULE | Freq: Every day | ORAL | 0 refills | Status: DC

## 2018-08-26 NOTE — Telephone Encounter (Signed)
Pt was last seen in oct 2019. Pt needs a refill on omeprazole and bcp . [facility] [address]. Pt has an appt with her new PCP due to change in insurance she can no longer see laura.

## 2018-08-26 NOTE — Telephone Encounter (Signed)
Gave one month refill of both medications; refills must come from new PCP going forward.

## 2018-08-27 NOTE — Telephone Encounter (Signed)
Sent letter out to patient today.

## 2018-08-27 NOTE — Telephone Encounter (Signed)
Attempted to reach patient today but number listed was not her number any longer.

## 2018-09-10 ENCOUNTER — Other Ambulatory Visit: Payer: Self-pay | Admitting: Family

## 2018-09-10 DIAGNOSIS — K219 Gastro-esophageal reflux disease without esophagitis: Secondary | ICD-10-CM

## 2018-09-10 DIAGNOSIS — E282 Polycystic ovarian syndrome: Secondary | ICD-10-CM

## 2018-09-11 ENCOUNTER — Other Ambulatory Visit: Payer: Self-pay | Admitting: Family

## 2018-09-11 DIAGNOSIS — E282 Polycystic ovarian syndrome: Secondary | ICD-10-CM

## 2018-09-16 ENCOUNTER — Telehealth: Payer: Self-pay | Admitting: Family

## 2018-09-17 ENCOUNTER — Telehealth: Payer: Self-pay

## 2018-09-17 ENCOUNTER — Other Ambulatory Visit: Payer: Self-pay | Admitting: Family

## 2018-09-17 DIAGNOSIS — E282 Polycystic ovarian syndrome: Secondary | ICD-10-CM

## 2018-09-17 DIAGNOSIS — K219 Gastro-esophageal reflux disease without esophagitis: Secondary | ICD-10-CM

## 2018-09-17 MED ORDER — OMEPRAZOLE 20 MG PO CPDR: 20.0000 mg | DELAYED_RELEASE_CAPSULE | Freq: Every day | ORAL | 0 refills | Status: DC

## 2018-09-17 MED ORDER — NORGESTIMATE-ETH ESTRADIOL 0.25-35 MG-MCG PO TABS: 1.0000 | ORAL_TABLET | Freq: Every day | ORAL | 0 refills | Status: DC

## 2018-09-17 NOTE — Telephone Encounter (Signed)
Spoke with patient and info given 

## 2018-09-17 NOTE — Telephone Encounter (Signed)
Copied from [place] [phone]. Topic: General - Other >> Sep 16, 2018  3:05 PM Antonieta Iba C wrote: Reason for CRM: pt called in stating that she never received her Rx for norgestimate-ethinyl estradiol (ORTHO-CYCLEN) 0.25-35 MG-MCG tablet and also omeprazole (PRILOSEC) 20 MG capsule  Pt would like assist with this further

## 2018-09-17 NOTE — Telephone Encounter (Signed)
One more month of refills sent;

## 2019-05-08 ENCOUNTER — Ambulatory Visit: Payer: Self-pay | Attending: Internal Medicine

## 2019-05-08 DIAGNOSIS — Z23 Encounter for immunization: Secondary | ICD-10-CM

## 2019-05-08 NOTE — Progress Notes (Signed)
   Covid-19 Vaccination Clinic  Name:  TIAIRA ARAMBULA    MRN: [id number] DOB: 03-29-1995  05/08/2019  Ms. Mangieri was observed post Covid-19 immunization for 15 minutes without incident. She was provided with Vaccine Information Sheet and instruction to access the V-Safe system.   Ms. Tonche was instructed to call 911 with any severe reactions post vaccine: Marland Kitchen Difficulty breathing  . Swelling of face and throat  . A fast heartbeat  . A bad rash all over body  . Dizziness and weakness   Immunizations Administered    Name Date Dose VIS Date Route   Pfizer COVID-19 Vaccine 05/08/2019  2:25 PM 0.3 mL 01/24/2019 Intramuscular   Manufacturer: [employer], [employer]   Lot: [id number]   NDC: [address]-7

## 2019-06-02 ENCOUNTER — Ambulatory Visit: Payer: Self-pay | Attending: Internal Medicine

## 2019-06-02 DIAGNOSIS — Z23 Encounter for immunization: Secondary | ICD-10-CM

## 2019-06-02 NOTE — Progress Notes (Signed)
   Covid-19 Vaccination Clinic  Name:  NICOLE DEFINO    MRN: [id number] DOB: 1995-06-26  06/02/2019  Ms. Marotto was observed post Covid-19 immunization for 15 minutes without incident. She was provided with Vaccine Information Sheet and instruction to access the V-Safe system.   Ms. Diego was instructed to call 911 with any severe reactions post vaccine: Marland Kitchen Difficulty breathing  . Swelling of face and throat  . A fast heartbeat  . A bad rash all over body  . Dizziness and weakness   Immunizations Administered    Name Date Dose VIS Date Route   Pfizer COVID-19 Vaccine 06/02/2019 12:27 PM 0.3 mL 04/09/2018 Intramuscular   Manufacturer: [employer], [employer]   Lot: [id number]   NDC: [address]-7

## 2019-10-01 IMAGING — US US PELVIS COMPLETE
1 series · 14 of 25 positions shown · non-contrast
Comparison: None.

CLINICAL DATA: Pelvic pain for 2 months

EXAM:
TRANSABDOMINAL ULTRASOUND OF PELVIS
TECHNIQUE: Transabdominal ultrasound examination of the pelvis was performed
including evaluation of the uterus, ovaries, adnexal regions, and
pelvic cul-de-sac.

[Series 1: us pelvis complete · 0.25mm/px · 14 of 38 slices shown]
[im 1/38]
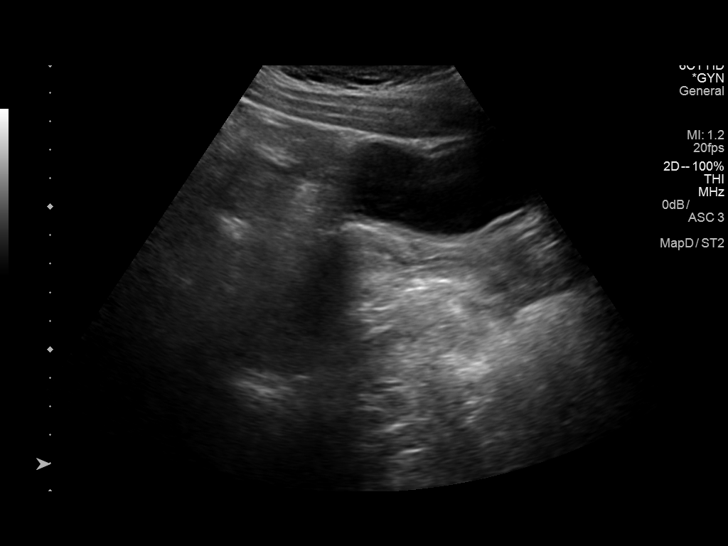
[im 4/38]
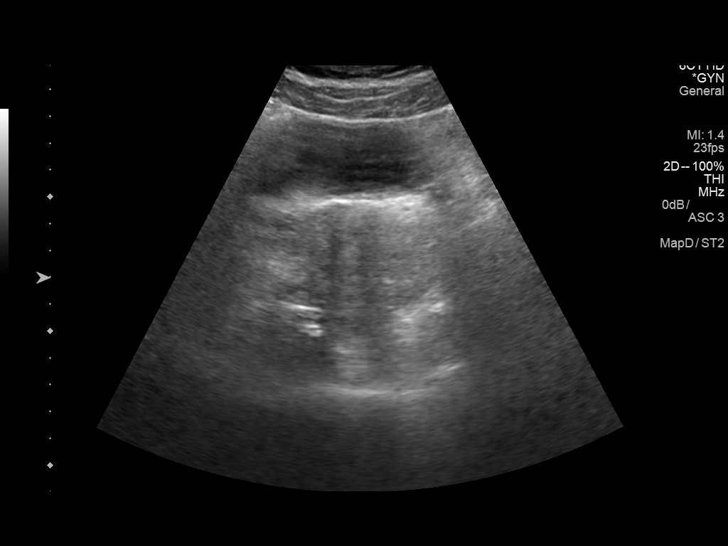
[im 7/38]
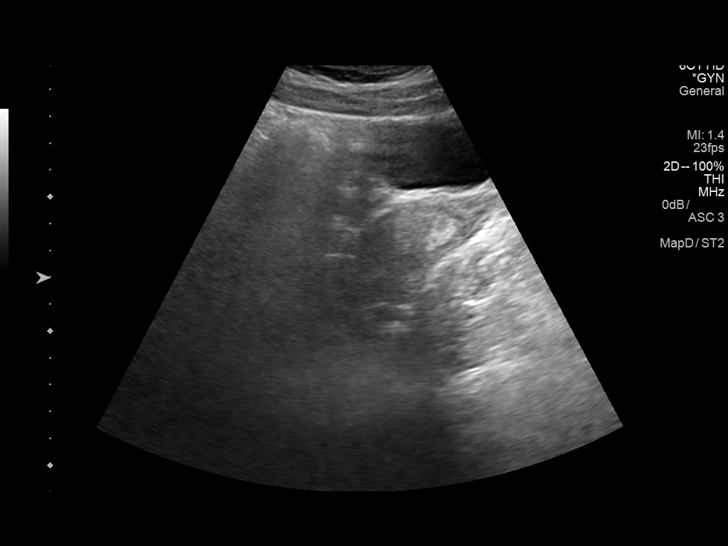
[im 10/38]
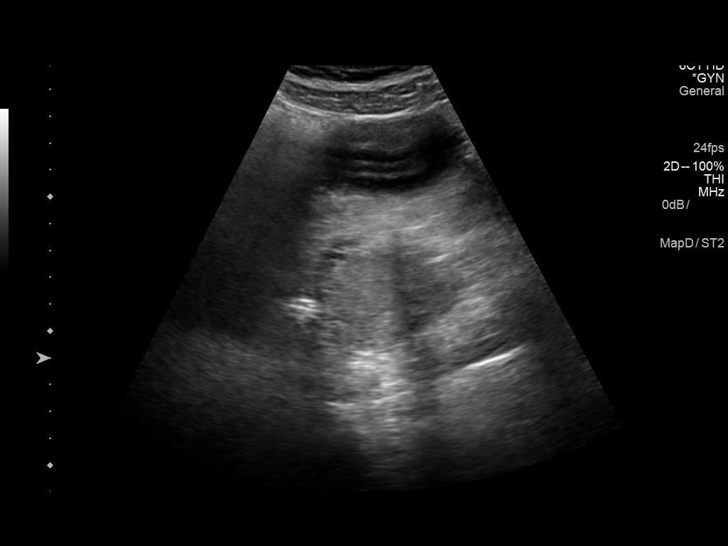
[im 13/38]
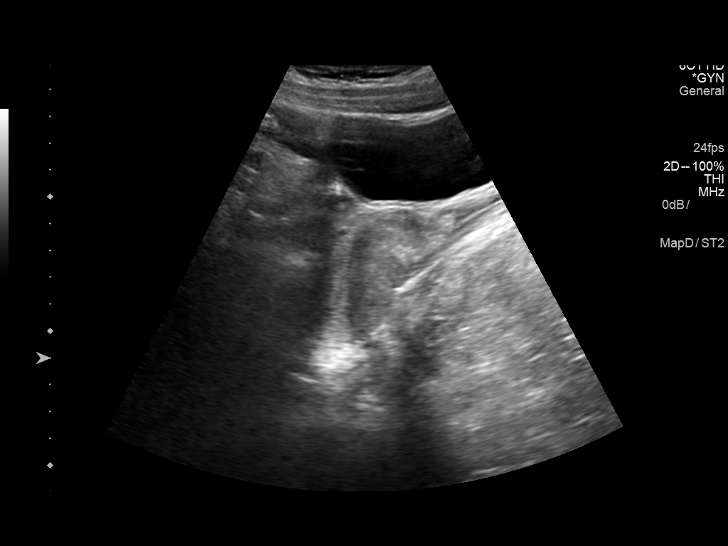
[im 14/38]
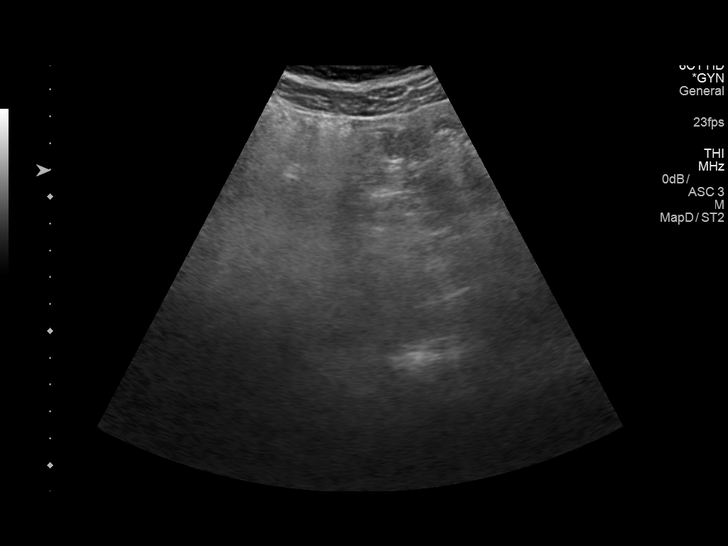
[im 17/38]
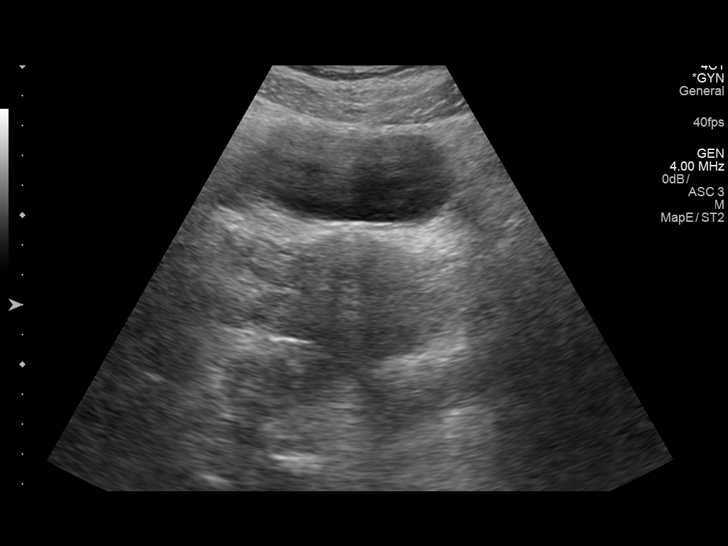
[im 21/38]
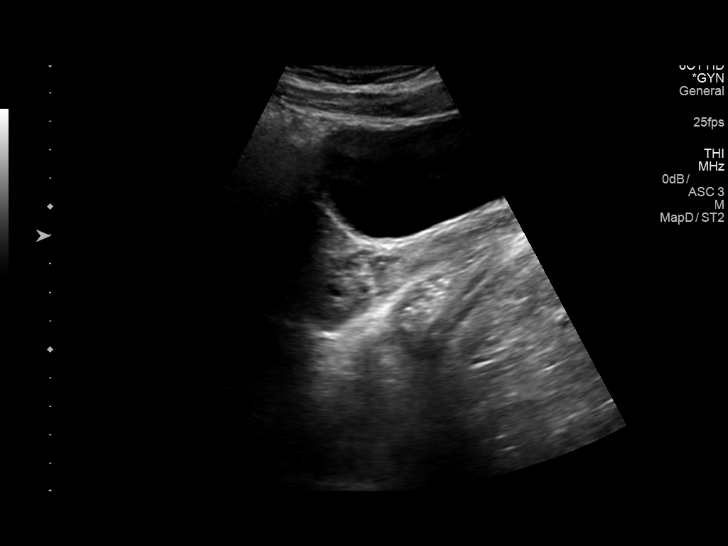
[im 24/38]
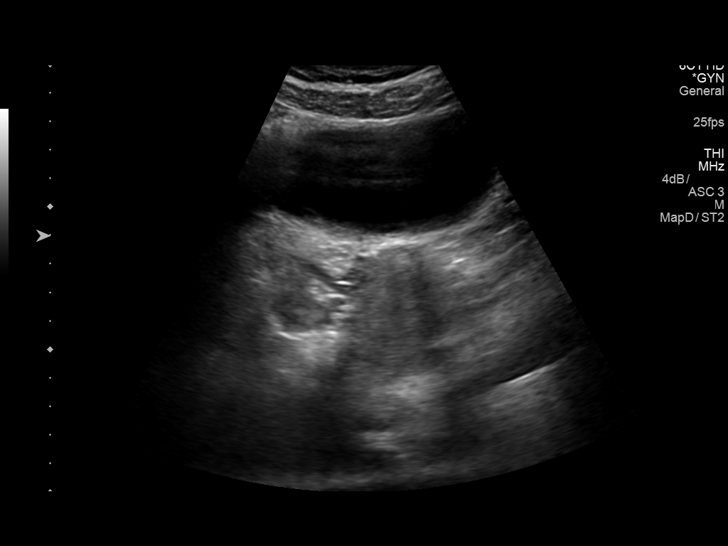
[im 25/38]
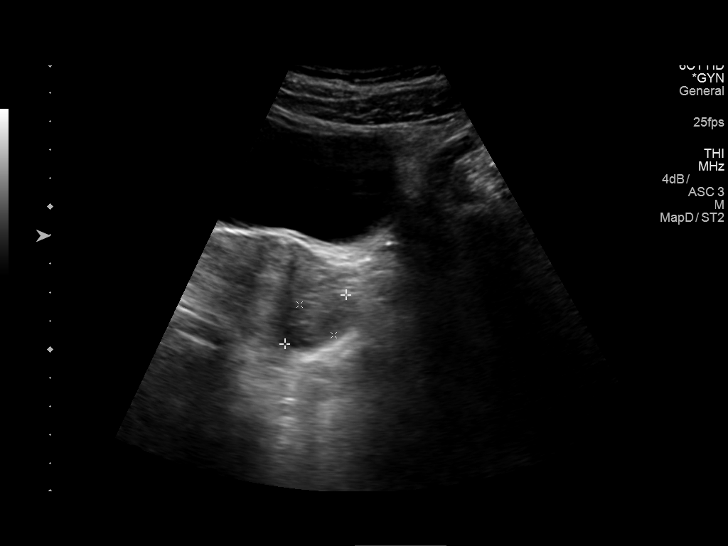
[im 28/38]
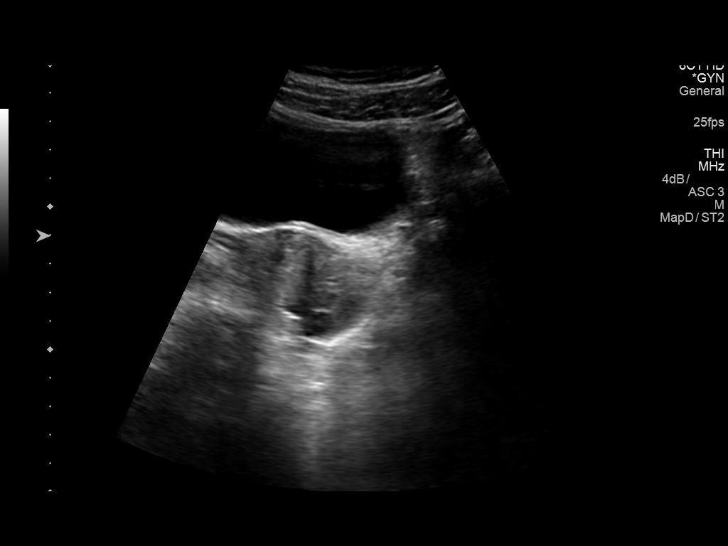
[im 31/38]
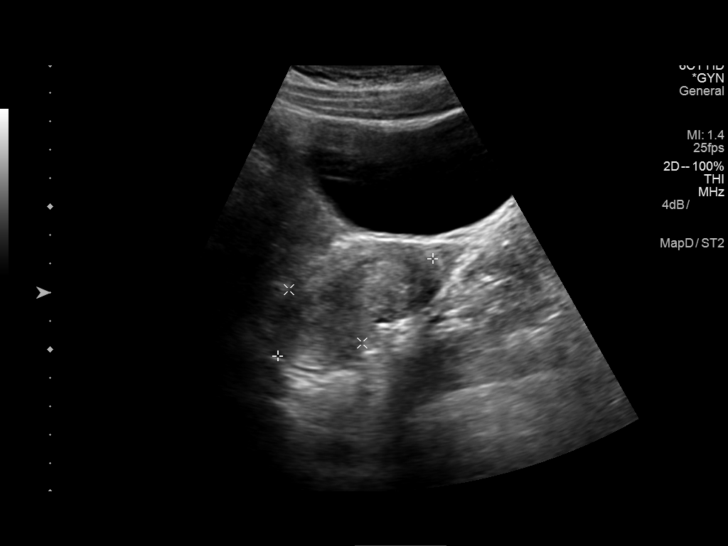
[im 34/38]
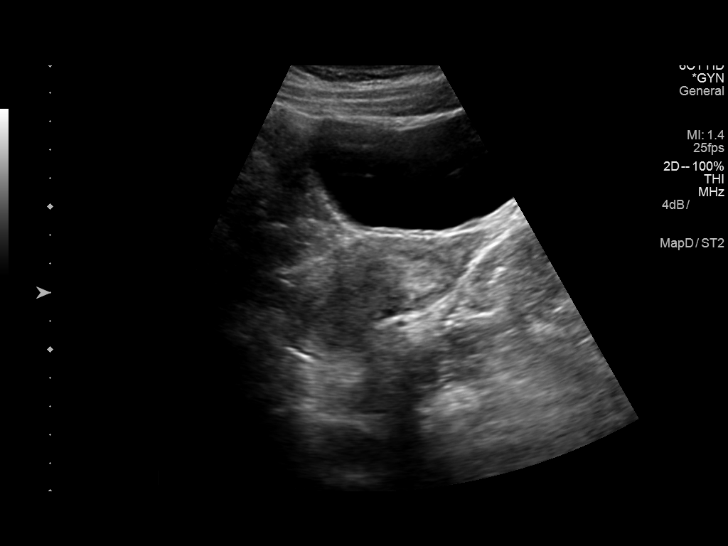
[im 38/38]
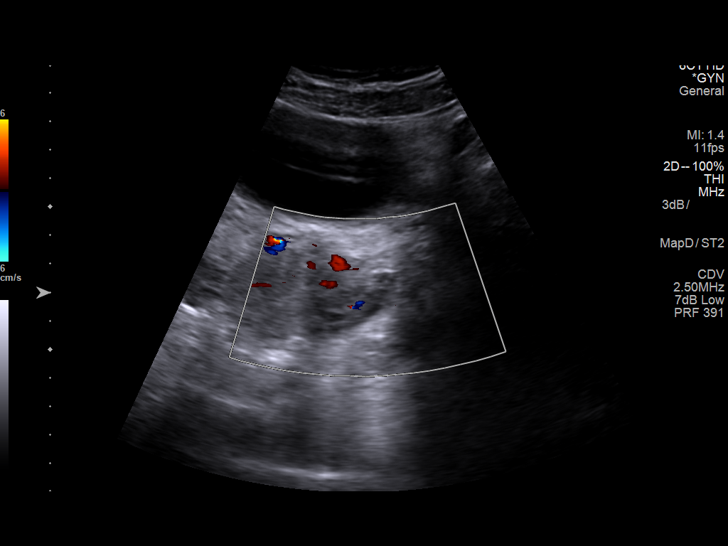

[14 of 25 positions shown; findings below may reference images not displayed]

FINDINGS: Uterus

Measurements: 6.4 x 3.2 x 4 cm. No fibroids or other mass
visualized.

Endometrium

Thickness: 7.1 mm.  No focal abnormality visualized.

Right ovary

Measurements: 2.7 x 1.8 x 2.5 cm. Normal appearance/no adnexal mass.

Left ovary

Measurements: 2.8 x 1.6 x 1.8 cm. Normal appearance/no adnexal mass.

Other findings:  No abnormal free fluid.
IMPRESSION: Negative transabdominal pelvic ultrasound

## 2021-06-29 ENCOUNTER — Encounter (HOSPITAL_COMMUNITY): Payer: Self-pay

## 2021-06-29 ENCOUNTER — Ambulatory Visit (HOSPITAL_COMMUNITY)
Admission: EM | Admit: 2021-06-29 | Discharge: 2021-06-29 | Disposition: A | Discharge status: Home / Self Care | Payer: Commercial Managed Care - HMO | Attending: Internal Medicine | Admitting: Internal Medicine

## 2021-06-29 DIAGNOSIS — J301 Allergic rhinitis due to pollen: Secondary | ICD-10-CM | POA: Diagnosis not present

## 2021-06-29 DIAGNOSIS — S060X0A Concussion without loss of consciousness, initial encounter: Secondary | ICD-10-CM

## 2021-06-29 MED ORDER — CETIRIZINE HCL 10 MG PO TABS: 10.0000 mg | ORAL_TABLET | Freq: Every day | ORAL | 2 refills | Status: DC

## 2021-06-29 MED ORDER — FLUTICASONE PROPIONATE 50 MCG/ACT NA SUSP: 1.0000 | Freq: Every day | NASAL | 2 refills | Status: DC

## 2021-06-29 MED ORDER — IBUPROFEN 600 MG PO TABS: 600.0000 mg | ORAL_TABLET | Freq: Four times a day (QID) | ORAL | 0 refills | Status: DC | PRN

## 2021-06-29 MED ORDER — ACETAMINOPHEN 500 MG PO TABS: 1000.0000 mg | ORAL_TABLET | Freq: Four times a day (QID) | ORAL | 0 refills | Status: DC | PRN

## 2021-06-29 MED ORDER — ACETAMINOPHEN 325 MG PO TABS: 975.0000 mg | ORAL_TABLET | Freq: Once | ORAL | Status: DC

## 2021-06-29 NOTE — Discharge Instructions (Addendum)
You were seen at urgent care for evaluation of your headache.  I believe that you may have a mild concussion causing your symptoms.  Avoid bright lights and screens for the next 2 to 3 days depending on your symptoms.  If your symptoms improve, you may go back to work on Monday.  I have given you a work note to excuse you until then. ? ?You may take 1000 mg Tylenol and 600 mg ibuprofen every 6 hours with food for your headache.  Your next dose of Tylenol can be in 6 hours since you were given some today at the clinic. ? ?I have prescribed cetirizine and Flonase for you to take daily for your seasonal allergies.  Do 1 puff into each nostril of Flonase daily to treat your nasal swelling and nasal congestion.  Cetirizine will help to dry up your secretions.  You may not need these medications during the summer, but you may need them again in the fall based on allergy seasons. ? ?If your concussion symptoms do not get better in the next week or 2, please call [facility] and schedule an appointment to be seen for further evaluation.  ? ?I have signed you up for PCP assistance today.  You will be receiving a phone call from someone at [facility] health to set up a primary care provider in the area.  ?[email or web address] ? ?If you develop any new or worsening symptoms or do not improve in the next 2 to 3 days, please return.  If your symptoms are severe, please go to the emergency room.  Follow-up with your primary care provider for further evaluation and management of your symptoms as well as ongoing wellness visits.  I hope you feel better! ?

## 2021-06-29 NOTE — ED Triage Notes (Signed)
2 days ago, while playing with her husband, Pt reports that she hit the left side of her head against his knee causing a sudden onset of pain throughout her head. Pt c/o having a HA since the hit. No meds taken. Denies vision changes. Notes butterflies in her stomach and feeling light in her body. ?

## 2021-06-29 NOTE — ED Provider Notes (Signed)
?[facility] ? ? ? ?CSN: [id number] ?Arrival date & time: 06/29/21  1259 ? ? ?  ? ?History   ?Chief Complaint ?Chief Complaint  ?Patient presents with  ? Head Injury  ? ? ?HPI ?Amanda Harper is a 26 y.o. female.  ? ?Patient presents to urgent care for evaluation of her possible head injury that she sustained while rough housing with her husband 2 days ago. She hit the left side of her head to her husband's left knee and immediately developed a generalized headache. Denies dizziness, nausea, blurry vision, decreased visual acuity, and fever. Headache over the last 2 days comes and goes randomly. Headache starts with movement and describes pain as "splitting head pain" when she bends forward to pick something up off of the ground. She is experiencing sensitivity to light, but denies sensitivity to sound. Denies bleeding from her head, she is not on blood thinners. No memory impairment or confusion. 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 Denies taking medication for head pain at home. Currently rates her head pain at a 4 on a scale of 0-10 and states that it does not become worse than a 4. Describes pain as a squeezing pain. Denies any other aggravating or relieving  factors at this time.  ? ? ?Head Injury ? ?Past Medical History:  ?Diagnosis Date  ? GERD (gastroesophageal reflux disease)   ? Osgood-Schlatter's disease of left knee   ? PCOS (polycystic ovarian syndrome)   ? ? ?Patient Active Problem List  ? Diagnosis Date Noted  ? Conjunctivitis of right eye 02/11/2018  ? Cough 02/11/2018  ? Patella alta 11/05/2017  ? Acne 11/27/2016  ? Allergic rhinitis 11/27/2016  ? Noise-induced hearing loss of both ears 11/15/2015  ? Patellar tendinitis 10/25/2015  ? GERD (gastroesophageal reflux disease) 06/08/2014  ? Osgood-Schlatter's disease of left knee 06/08/2014  ? PCOS (polycystic  ovarian syndrome) 06/08/2014  ? Routine general medical examination at a health care facility 06/08/2014  ? Helicobacter pylori gastritis 07/11/2011  ? ? ?History reviewed. No pertinent surgical history. ? ?OB History   ?No obstetric history on file. ?  ? ? ? ?Home Medications   ? ?Prior to Admission medications   ?Medication Sig Start Date End Date Taking? Authorizing Provider  ?acetaminophen (TYLENOL) 500 MG tablet Take 2 tablets (1,000 mg total) by mouth every 6 (six) hours as needed. 06/29/21  Yes Carlisle Beers, FNP  ?cetirizine (ZYRTEC) 10 MG tablet Take 1 tablet (10 mg total) by mouth daily. 06/29/21  Yes Carlisle Beers, FNP  ?fluticasone (FLONASE) 50 MCG/ACT nasal spray Place 1 spray into both nostrils daily. 06/29/21  Yes Carlisle Beers, FNP  ?ibuprofen (ADVIL) 600 MG tablet Take 1 tablet (600 mg total) by mouth every 6 (six) hours as needed. 06/29/21  Yes Carlisle Beers, FNP  ?Adapalene-Benzoyl Peroxide (EPIDUO) 0.1-2.5 % gel APPLY A THIN LAYER ONCE NIGHTLY TO THE FACE, CHEST, AND BACK 10/10/17   Olive Bass, FNP  ?norgestimate-ethinyl estradiol (ORTHO-CYCLEN) 0.25-35 MG-MCG tablet Take 1 tablet by mouth daily. 09/17/18   Olive Bass, FNP  ?omeprazole (PRILOSEC) 20 MG capsule Take 1 capsule (20 mg total) by mouth daily. 09/17/18   Olive Bass, FNP  ? ? ?Family History ?Family History  ?Problem Relation Age of Onset  ? Hypertension Father   ? Hypertension Sister   ?  Hypertension Maternal Grandmother   ? Hypertension Maternal Grandfather   ? Diabetes Maternal Grandfather   ? Hyperlipidemia Paternal Grandmother   ? Cancer Neg Hx   ? Alcohol abuse Neg Hx   ? Drug abuse Neg Hx   ? Early death Neg Hx   ? Heart disease Neg Hx   ? Kidney disease Neg Hx   ? ? ?Social History ?Social History  ? ?Tobacco Use  ? Smoking status: Never  ? Smokeless tobacco: Never  ?Substance Use Topics  ? Alcohol use: No  ? Drug use: No  ? ? ? ?Allergies   ?Other ? ? ?Review of  Systems ?Review of Systems ?Per HPI ? ?Physical Exam ?Triage Vital Signs ?ED Triage Vitals [06/29/21 1455]  ?Enc Vitals Group  ?   BP 138/73  ?   Pulse Rate 88  ?   Resp 18  ?   Temp 98.1 ?F (36.7 ?C)  ?   Temp Source Oral  ?   SpO2 100 %  ?   Weight   ?   Height   ?   Head Circumference   ?   Peak Flow   ?   Pain Score 0  ?   Pain Loc   ?   Pain Edu?   ?   Excl. in GC?   ? ?No data found. ? ?Updated Vital Signs ?BP 138/73 (BP Location: Left Arm)   Pulse 88   Temp 98.1 ?F (36.7 ?C) (Oral)   Resp 18   SpO2 100%  ? ?Visual Acuity ?Right Eye Distance:   ?Left Eye Distance:   ?Bilateral Distance:   ? ?Right Eye Near:   ?Left Eye Near:    ?Bilateral Near:    ? ?Physical Exam ?Vitals and nursing note reviewed.  ?Constitutional:   ?   General: She is not in acute distress. ?   Appearance: Normal appearance. She is well-developed. She is not ill-appearing or diaphoretic.  ?HENT:  ?   Head: Normocephalic and atraumatic.  ?   Right Ear: Tympanic membrane, ear canal and external ear normal.  ?   Left Ear: Tympanic membrane, ear canal and external ear normal.  ?   Nose: Rhinorrhea present.  ?   Mouth/Throat:  ?   Mouth: Mucous membranes are moist.  ?   Pharynx: Posterior oropharyngeal erythema present.  ?   Comments: Mild erythema to posterior oropharynx with small amount of clear postnasal drainage visualized. Airway intact and patent. ?Eyes:  ?   General: No visual field deficit or scleral icterus. ?   Extraocular Movements: Extraocular movements intact.  ?   Conjunctiva/sclera: Conjunctivae normal.  ?Cardiovascular:  ?   Rate and Rhythm: Normal rate and regular rhythm.  ?   Heart sounds: Normal heart sounds. No murmur heard. ?  No friction rub. No gallop.  ?Pulmonary:  ?   Effort: Pulmonary effort is normal. No respiratory distress.  ?   Breath sounds: Normal breath sounds. No wheezing, rhonchi or rales.  ?Chest:  ?   Chest wall: No tenderness.  ?Abdominal:  ?   General: Bowel sounds are normal.  ?   Palpations: Abdomen  is soft.  ?   Tenderness: There is no abdominal tenderness. There is no right CVA tenderness or left CVA tenderness.  ?Musculoskeletal:     ?   General: No swelling. Normal range of motion.  ?   Cervical back: Normal range of motion and neck supple.  ?Lymphadenopathy:  ?  Cervical: No cervical adenopathy.  ?Skin: ?   General: Skin is warm and dry.  ?   Capillary Refill: Capillary refill takes less than 2 seconds.  ?   Findings: No rash.  ?Neurological:  ?   General: No focal deficit present.  ?   Mental Status: She is alert and oriented to person, place, and time. Mental status is at baseline.  ?   Cranial Nerves: Cranial nerves 2-12 are intact. No cranial nerve deficit, dysarthria or facial asymmetry.  ?   Sensory: Sensation is intact. No sensory deficit.  ?   Motor: Motor function is intact. No weakness or pronator drift.  ?   Coordination: Coordination is intact. Romberg sign negative. Coordination normal. Finger-Nose-Finger Test normal.  ?   Gait: Gait is intact. Gait normal.  ?   Comments: 5/5 grip strength to upper extremities.  5/5 dorsiflexion and plantarflexion against resistance to lower extremities bilaterally.  Sensation normal to face, upper bilateral extremities and lower bilateral extremities.  ?Psychiatric:     ?   Mood and Affect: Mood normal.     ?   Behavior: Behavior normal.     ?   Thought Content: Thought content normal.     ?   Judgment: Judgment normal.  ? ? ? ?UC Treatments / Results  ?Labs ?(all labs ordered are listed, but only abnormal results are displayed) ?Labs Reviewed - No data to display ? ?EKG ? ? ?Radiology ?No results found. ? ?Procedures ?Procedures (including critical care time) ? ?Medications Ordered in UC ?Medications - No data to display ? ?Initial Impression / Assessment and Plan / UC Course  ?I have reviewed the triage vital signs and the nursing notes. ? ?Pertinent labs & imaging results that were available during my care of the patient were reviewed by me and considered  in my medical decision making (see chart for details). ? ?Patient presents urgent care with a mild concussion after she was roughhousing with her husband at home 2 days ago.  Neurologic exam is stable today.  She is

## 2022-02-13 NOTE — L&D Delivery Note (Signed)
[facility] Delivery Note  Amanda Harper is a 27 y.o. G2P0010 s/p SVD at [redacted]w[redacted]d. She was admitted for IOL for NRFS.   ROM: 11h 8m with Clear fluid GBS Status: Positive, s/p 3 doses PCN  Maximum Maternal Temperature: 98.9  Labor Progress: Patient was admitted for IOL due to NRFS remote from delivery. She was augmented with cytotec and AROM. She progressed to complete.   Delivery Date/Time: 01/16/2023 1258  Delivery: Called to room and patient was complete and pushing. Head delivered OA. Loose nuchal cord present and easily reduced at the perineum. Shoulder and body delivered in usual fashion. Infant with spontaneous cry, placed on mother's abdomen, dried and stimulated. Cord clamped x 2 after 1-minute delay, and cut by FOB. Cord blood drawn. Placenta delivered spontaneously with gentle cord traction. Fundus firm with massage and Pitocin. Labia, perineum, vagina, and cervix inspected inspected; noted small midline 1st degree and bilateral labial abrasions. Hemostatic without repair.   Placenta: 3 vessel cord, intact, no abnormalities  Complications: none Lacerations: Noted small midline 1st degree and bilateral labial abrasions. No repair.  EBL: 150cc Analgesia: Epidural  Postpartum Planning [ x] message to sent to schedule follow-up  [ ]  vaccines UTD  Infant: Vigorous Baby Boy  APGARs 7 / 10   3232 g Required deep suctioning in delivery room   Nelta Numbers, MD  Visiting Resident PGY3 - Family Medicine    01/16/2023 1:46 PM

## 2022-02-22 ENCOUNTER — Inpatient Hospital Stay (HOSPITAL_COMMUNITY): Payer: BLUE CROSS/BLUE SHIELD

## 2022-02-22 ENCOUNTER — Encounter (HOSPITAL_COMMUNITY): Payer: Self-pay | Admitting: Obstetrics and Gynecology

## 2022-02-22 ENCOUNTER — Inpatient Hospital Stay (HOSPITAL_COMMUNITY)
Admission: AD | Admit: 2022-02-22 | Discharge: 2022-02-22 | Disposition: A | Discharge status: Home / Self Care | Payer: BLUE CROSS/BLUE SHIELD | Attending: Obstetrics and Gynecology | Admitting: Obstetrics and Gynecology

## 2022-02-22 DIAGNOSIS — O99611 Diseases of the digestive system complicating pregnancy, first trimester: Secondary | ICD-10-CM | POA: Diagnosis not present

## 2022-02-22 DIAGNOSIS — K5900 Constipation, unspecified: Secondary | ICD-10-CM | POA: Diagnosis not present

## 2022-02-22 DIAGNOSIS — O209 Hemorrhage in early pregnancy, unspecified: Secondary | ICD-10-CM | POA: Diagnosis not present

## 2022-02-22 DIAGNOSIS — O3680X0 Pregnancy with inconclusive fetal viability, not applicable or unspecified: Secondary | ICD-10-CM | POA: Diagnosis not present

## 2022-02-22 DIAGNOSIS — Z3A01 Less than 8 weeks gestation of pregnancy: Secondary | ICD-10-CM | POA: Insufficient documentation

## 2022-02-22 DIAGNOSIS — O99281 Endocrine, nutritional and metabolic diseases complicating pregnancy, first trimester: Secondary | ICD-10-CM | POA: Diagnosis not present

## 2022-02-22 LAB — WET PREP, GENITAL
Clue Cells Wet Prep HPF POC: NONE SEEN
Sperm: NONE SEEN
Trich, Wet Prep: NONE SEEN
WBC, Wet Prep HPF POC: <10 (ref <10)
Yeast Wet Prep HPF POC: NONE SEEN

## 2022-02-22 LAB — ABO/RH: ABO/RH(D): O POS

## 2022-02-22 LAB — CBC
HCT: 39.8 % (ref 36.0–46.0)
Hemoglobin: 12.9 g/dL (ref 12.0–15.0)
MCH: 26.1 pg (ref 26.0–34.0)
MCHC: 32.4 g/dL (ref 30.0–36.0)
MCV: 80.4 fL (ref 80.0–100.0)
Platelets: 300 10*3/uL (ref 150–400)
RBC: 4.95 MIL/uL (ref 3.87–5.11)
RDW: 12.6 % (ref 11.5–15.5)
WBC: 7.2 10*3/uL (ref 4.0–10.5)
nRBC: 0 % (ref 0.0–0.2)

## 2022-02-22 LAB — URINALYSIS, ROUTINE W REFLEX MICROSCOPIC
Bilirubin Urine: NEGATIVE
Glucose, UA: NEGATIVE mg/dL
Hgb urine dipstick: NEGATIVE
Ketones, ur: 5 mg/dL — AB
Leukocytes,Ua: NEGATIVE
Nitrite: NEGATIVE
Protein, ur: NEGATIVE mg/dL
Specific Gravity, Urine: 1.003 — ABNORMAL LOW (ref 1.005–1.030)
pH: 5 (ref 5.0–8.0)

## 2022-02-22 LAB — HCG, QUANTITATIVE, PREGNANCY: hCG, Beta Chain, Quant, S: 381 m[IU]/mL — ABNORMAL HIGH (ref <5)

## 2022-02-22 NOTE — MAU Note (Signed)
..  Amanda Harper is a 27 y.o. at [redacted]w[redacted]d here in MAU reporting: vaginal bleeding when she wipes that began less than an hour ago. Reports mild abdominal cramping that began around 4 pm LMP: 01/25/2022  Pain score: 1/10 Vitals:   02/22/22 1916  BP: 139/77  Pulse: 74  Resp: 16  Temp: 98.6 F (37 C)  SpO2: 100%

## 2022-02-22 NOTE — MAU Provider Note (Signed)
History     [id number]  Arrival date and time: 02/22/22 1859    Chief Complaint  Patient presents with   Vaginal Bleeding     HPI Amanda Harper is a 27 y.o. at [redacted]w[redacted]d who presents for vaginal spotting. Symptoms started about an hour prior to arrival. Reports bright red spotting on toilet paper. Not bleeding into a pad or passing blood clots. Reports some abdominal cramping that she says she wouldn't describe as pain. Has had nausea for the last week. Also some issues with constipation - last BM earlier today. Denies dysuria, vaginal discharge, or recent intercourse.    OB History     Gravida  1   Para      Term      Preterm      AB      Living         SAB      IAB      Ectopic      Multiple      Live Births              Past Medical History:  Diagnosis Date   GERD (gastroesophageal reflux disease)    Osgood-Schlatter's disease of left knee    PCOS (polycystic ovarian syndrome)     Past Surgical History:  Procedure Laterality Date   NO PAST SURGERIES      Family History  Problem Relation Age of Onset   Hypertension Father    Hypertension Sister    Hypertension Maternal Grandmother    Hypertension Maternal Grandfather    Diabetes Maternal Grandfather    Hyperlipidemia Paternal Grandmother    Cancer Neg Hx    Alcohol abuse Neg Hx    Drug abuse Neg Hx    Early death Neg Hx    Heart disease Neg Hx    Kidney disease Neg Hx     Allergies  Allergen Reactions   Other Rash    Henna dye    No current facility-administered medications on file prior to encounter.   Current Outpatient Medications on File Prior to Encounter  Medication Sig Dispense Refill   acetaminophen (TYLENOL) 500 MG tablet Take 2 tablets (1,000 mg total) by mouth every 6 (six) hours as needed. 30 tablet 0   Adapalene-Benzoyl Peroxide (EPIDUO) 0.1-2.5 % gel APPLY A THIN LAYER ONCE NIGHTLY TO THE FACE, CHEST, AND BACK 45 g 5   cetirizine (ZYRTEC) 10 MG tablet Take 1 tablet  (10 mg total) by mouth daily. 30 tablet 2   fluticasone (FLONASE) 50 MCG/ACT nasal spray Place 1 spray into both nostrils daily. 16 g 2   omeprazole (PRILOSEC) 20 MG capsule Take 1 capsule (20 mg total) by mouth daily. 30 capsule 0     ROS Pertinent positives and negative per HPI, all others reviewed and negative  Physical Exam   BP 139/77 (BP Location: Right Arm)   Pulse 74   Temp 98.6 F (37 C) (Oral)   Resp 16   Ht 5\' 5"  (1.651 m)   Wt 99.2 kg   LMP 01/25/2022   SpO2 100%   BMI 36.38 kg/m   Patient Vitals for the past 24 hrs:  BP Temp Temp src Pulse Resp SpO2 Height Weight  02/22/22 1916 139/77 98.6 F (37 C) Oral 74 16 100 % 5\' 5"  (1.651 m) 99.2 kg    Physical Exam Vitals and nursing note reviewed.  Constitutional:      Appearance: Normal appearance.  HENT:  Head: Normocephalic and atraumatic.  Eyes:     General: No scleral icterus.    Conjunctiva/sclera: Conjunctivae normal.  Pulmonary:     Effort: Pulmonary effort is normal. No respiratory distress.  Neurological:     Mental Status: She is alert.  Psychiatric:        Mood and Affect: Mood normal.        Behavior: Behavior normal.       Labs Results for orders placed or performed during the hospital encounter of 02/22/22 (from the past 24 hour(s))  Wet prep, genital     Status: None   Collection Time: 02/22/22  7:28 PM   Specimen: Vaginal  Result Value Ref Range   Yeast Wet Prep HPF POC NONE SEEN NONE SEEN   Trich, Wet Prep NONE SEEN NONE SEEN   Clue Cells Wet Prep HPF POC NONE SEEN NONE SEEN   WBC, Wet Prep HPF POC <10 <10   Sperm NONE SEEN   Urinalysis, Routine w reflex microscopic Urine, Clean Catch     Status: Abnormal   Collection Time: 02/22/22  7:54 PM  Result Value Ref Range   Color, Urine COLORLESS (A) YELLOW   APPearance CLEAR CLEAR   Specific Gravity, Urine 1.003 (L) 1.005 - 1.030   pH 5.0 5.0 - 8.0   Glucose, UA NEGATIVE NEGATIVE mg/dL   Hgb urine dipstick NEGATIVE NEGATIVE    Bilirubin Urine NEGATIVE NEGATIVE   Ketones, ur 5 (A) NEGATIVE mg/dL   Protein, ur NEGATIVE NEGATIVE mg/dL   Nitrite NEGATIVE NEGATIVE   Leukocytes,Ua NEGATIVE NEGATIVE  CBC     Status: None   Collection Time: 02/22/22  8:00 PM  Result Value Ref Range   WBC 7.2 4.0 - 10.5 K/uL   RBC 4.95 3.87 - 5.11 MIL/uL   Hemoglobin 12.9 12.0 - 15.0 g/dL   HCT 39.8 36.0 - 46.0 %   MCV 80.4 80.0 - 100.0 fL   MCH 26.1 26.0 - 34.0 pg   MCHC 32.4 30.0 - 36.0 g/dL   RDW 12.6 11.5 - 15.5 %   Platelets 300 150 - 400 K/uL   nRBC 0.0 0.0 - 0.2 %  hCG, quantitative, pregnancy     Status: Abnormal   Collection Time: 02/22/22  8:00 PM  Result Value Ref Range   hCG, Beta Chain, Quant, S 381 (H) <5 mIU/mL  ABO/Rh     Status: None   Collection Time: 02/22/22  8:07 PM  Result Value Ref Range   ABO/RH(D) O POS    No rh immune globuloin      NOT A RH IMMUNE GLOBULIN CANDIDATE, PT RH POSITIVE Performed at [facility], [address], [place], [place] [address]     Imaging US OB LESS THAN 14 WEEKS WITH OB TRANSVAGINAL  Result Date: 02/22/2022 CLINICAL DATA:  Vaginal bleeding EXAM: OBSTETRIC <14 WK [place] AND TRANSVAGINAL OB US TECHNIQUE: Both transabdominal and transvaginal ultrasound examinations were performed for complete evaluation of the gestation as well as the maternal uterus, adnexal regions, and pelvic cul-de-sac. Transvaginal technique was performed to assess early pregnancy. COMPARISON:  10/24/2017 FINDINGS: Intrauterine gestational sac: None Maternal uterus/adnexae: Retroverted uterus. No intrauterine gestational sac. No fluid or other abnormality within the endometrial canal. Right ovary containing corpus luteal cyst. Unremarkable left ovary. No adnexal masses. Small volume fluid within the cul-de-sac. IMPRESSION: No intrauterine pregnancy or findings suspicious for ectopic pregnancy. Findings are consistent with pregnancy of unknown location and may reflect early intrauterine pregnancy not yet  visualized sonographically, occult ectopic pregnancy, or failed pregnancy. Recommend trending of beta HCG as well as follow-up ultrasound in 7-10 days based on clinical course. Electronically Signed   By: Duanne Guess D.O.   On: 02/22/2022 20:38    MAU Course  Procedures Lab Orders         Wet prep, genital         Urinalysis, Routine w reflex microscopic Urine, Clean Catch         CBC         hCG, quantitative, pregnancy    No orders of the defined types were placed in this encounter.  Imaging Orders         US OB LESS THAN 14 WEEKS WITH OB TRANSVAGINAL     MDM +UPT UA, wet prep, GC/chlamydia, CBC, ABO/Rh, quant hCG, and [place] today to rule out ectopic pregnancy which can be life threatening.   RH positive  Ultrasound shows no IUP or EUP. HCG today is 381   Discussed with patient the diagnosis of pregnancy of unknown anatomic location.  Three possibilities of outcome are: a healthy pregnancy that is too early to see a yolk sac to confirm the pregnancy is in the uterus, a pregnancy that is not healthy and has not developed and will not develop, and an ectopic pregnancy that cannot be identified at this time. She is aware that she needs to follow-up in MAU on Saturday for repeat beta-hCG level to determine next steps. All questions were answered, MAU precautions discussed.  Assessment and Plan   1. Pregnancy of unknown anatomic location   2. Vaginal bleeding in pregnancy, first trimester   3. [redacted] weeks gestation of pregnancy    -Reviewed SAB vs ectopic precautions -GC/CT pending -Return to MAU Saturday for stat HCG   Judeth Horn, NP 02/22/22 9:11 PM

## 2022-02-22 NOTE — Discharge Instructions (Signed)
Return to care  If you have heavier bleeding that soaks through more than 2 pads per hour for an hour or more If you bleed so much that you feel like you might pass out or you do pass out If you have significant abdominal pain that is not improved with Tylenol   

## 2022-02-23 LAB — GC/CHLAMYDIA PROBE AMP (~~LOC~~) NOT AT ARMC
Chlamydia: NEGATIVE
Comment: NEGATIVE
Comment: NORMAL
Neisseria Gonorrhea: NEGATIVE

## 2022-02-25 ENCOUNTER — Inpatient Hospital Stay (HOSPITAL_COMMUNITY): Payer: BLUE CROSS/BLUE SHIELD

## 2022-02-25 ENCOUNTER — Other Ambulatory Visit: Payer: Self-pay

## 2022-02-25 ENCOUNTER — Inpatient Hospital Stay (EMERGENCY_DEPARTMENT_HOSPITAL)
Admission: AD | Admit: 2022-02-25 | Discharge: 2022-02-25 | Disposition: A | Discharge status: Home / Self Care | Payer: BLUE CROSS/BLUE SHIELD | Source: Home / Self Care | Attending: Obstetrics and Gynecology | Admitting: Obstetrics and Gynecology

## 2022-02-25 ENCOUNTER — Emergency Department (HOSPITAL_COMMUNITY)
Admission: EM | Admit: 2022-02-25 | Discharge: 2022-02-25 | Disposition: A | Discharge status: Home / Self Care | Payer: BLUE CROSS/BLUE SHIELD | Attending: Emergency Medicine | Admitting: Emergency Medicine

## 2022-02-25 ENCOUNTER — Emergency Department (HOSPITAL_COMMUNITY): Payer: BLUE CROSS/BLUE SHIELD

## 2022-02-25 DIAGNOSIS — Z3A01 Less than 8 weeks gestation of pregnancy: Secondary | ICD-10-CM | POA: Insufficient documentation

## 2022-02-25 DIAGNOSIS — O26891 Other specified pregnancy related conditions, first trimester: Secondary | ICD-10-CM

## 2022-02-25 DIAGNOSIS — R109 Unspecified abdominal pain: Secondary | ICD-10-CM | POA: Insufficient documentation

## 2022-02-25 DIAGNOSIS — O3680X0 Pregnancy with inconclusive fetal viability, not applicable or unspecified: Secondary | ICD-10-CM | POA: Insufficient documentation

## 2022-02-25 DIAGNOSIS — R0789 Other chest pain: Secondary | ICD-10-CM | POA: Insufficient documentation

## 2022-02-25 DIAGNOSIS — O26899 Other specified pregnancy related conditions, unspecified trimester: Secondary | ICD-10-CM

## 2022-02-25 LAB — BASIC METABOLIC PANEL
Anion gap: 8 (ref 5–15)
BUN: 9 mg/dL (ref 6–20)
CO2: 24 mmol/L (ref 22–32)
Calcium: 9.1 mg/dL (ref 8.9–10.3)
Chloride: 105 mmol/L (ref 98–111)
Creatinine, Ser: 0.73 mg/dL (ref 0.44–1.00)
GFR, Estimated: >60 mL/min (ref >60)
Glucose, Bld: 96 mg/dL (ref 70–99)
Potassium: 3.6 mmol/L (ref 3.5–5.1)
Sodium: 137 mmol/L (ref 135–145)

## 2022-02-25 LAB — TROPONIN I (HIGH SENSITIVITY): Troponin I (High Sensitivity): <2 ng/L (ref <18)

## 2022-02-25 LAB — CBC WITH DIFFERENTIAL/PLATELET
Abs Immature Granulocytes: 0.01 10*3/uL (ref 0.00–0.07)
Basophils Absolute: 0 10*3/uL (ref 0.0–0.1)
Basophils Relative: 1 %
Eosinophils Absolute: 0.1 10*3/uL (ref 0.0–0.5)
Eosinophils Relative: 1 %
HCT: 40.8 % (ref 36.0–46.0)
Hemoglobin: 13.1 g/dL (ref 12.0–15.0)
Immature Granulocytes: 0 %
Lymphocytes Relative: 47 %
Lymphs Abs: 3 10*3/uL (ref 0.7–4.0)
MCH: 26.5 pg (ref 26.0–34.0)
MCHC: 32.1 g/dL (ref 30.0–36.0)
MCV: 82.4 fL (ref 80.0–100.0)
Monocytes Absolute: 0.6 10*3/uL (ref 0.1–1.0)
Monocytes Relative: 9 %
Neutro Abs: 2.7 10*3/uL (ref 1.7–7.7)
Neutrophils Relative %: 42 %
Platelets: 285 10*3/uL (ref 150–400)
RBC: 4.95 MIL/uL (ref 3.87–5.11)
RDW: 13 % (ref 11.5–15.5)
WBC: 6.5 10*3/uL (ref 4.0–10.5)
nRBC: 0 % (ref 0.0–0.2)

## 2022-02-25 LAB — HCG, QUANTITATIVE, PREGNANCY: hCG, Beta Chain, Quant, S: 1159 m[IU]/mL — ABNORMAL HIGH (ref <5)

## 2022-02-25 NOTE — MAU Note (Signed)
Jorje Guild, NP discharge pt and provided pt with discharge paperwork.    Amanda Harper

## 2022-02-25 NOTE — MAU Note (Signed)
.  Amanda Harper is a 27 y.o. at [redacted]w[redacted]d here in MAU reporting: that she is here today for a follow up beta.  Pt reports mild cramping in the lower abd.  Denies VB.   LMP: 01/25/22  Onset of complaint: 02/22/22 Pain score: 1 Vitals:   02/25/22 1227 02/25/22 1228  BP:  127/81  Pulse:  95  Resp:  18  Temp:  99 F (37.2 C)  SpO2: 100% 100%     Lab orders placed from triage:    Beta level

## 2022-02-25 NOTE — ED Triage Notes (Signed)
Pt reports sharp pain under left breast that comes and goes x 8 months. Pt states she recently found out she is pregnant.

## 2022-02-25 NOTE — ED Provider Triage Note (Signed)
Emergency Medicine Provider Triage Evaluation Note  Amanda Harper , a 27 y.o. female  was evaluated in triage.  Pt complains of chest pain that has been intermittently going on for the past 8 months.  Sometimes radiates to left arm.  She states she recently found out that she is pregnant.  She states she is estimated to be 4 weeks.  She states her chest pain is random and sometimes last for minutes other times hours.  She is unsure of any alleviating or aggravating factors.  States she has history of hyperlipidemia otherwise no cardiac disease.  States her father has history of CAD otherwise no family history of cardiac disease.  Denies prior history of DVT or PE.  No recent travel.  Denies any shortness of breath, lightheadedness, palpitations, or nausea..  Review of Systems  Positive: As above Negative: As above  Physical Exam  BP 134/74 (BP Location: Right Arm)   Pulse 91   Temp 98.9 F (37.2 C) (Oral)   Resp 14   Ht 5\' 5"  (1.651 m)   Wt 98.9 kg   LMP 01/25/2022   SpO2 100%   BMI 36.28 kg/m  Gen:   Awake, no distress   Resp:  Normal effort  MSK:   Moves extremities without difficulty  Other:    Medical Decision Making  Medically screening exam initiated at 3:13 PM.  Appropriate orders placed.  Amanda Harper was informed that the remainder of the evaluation will be completed by another provider, this initial triage assessment does not replace that evaluation, and the importance of remaining in the ED until their evaluation is complete.     Evlyn Courier, PA-C 02/25/22 9381

## 2022-02-25 NOTE — Discharge Instructions (Signed)
Return to care  If you have heavier bleeding that soaks through more than 2 pads per hour for an hour or more If you bleed so much that you feel like you might pass out or you do pass out If you have significant abdominal pain that is not improved with Tylenol   

## 2022-02-25 NOTE — Discharge Instructions (Signed)
Please use Tylenol for pain.  You may use 1000 mg of Tylenol every 6 hours.  Not to exceed 4 g of Tylenol within 24 hours.  In addition to continuing your home medications you can take Tums as needed for pain you are experiencing this is an additional acid relieving medication that should help if your symptoms are related to acid reflux.  I do recommend that you follow-up with your OB/GYN, primary care doctor, if you experience more severe, more frequent, or the quality of your chest pain changes please return to the emergency department for further evaluation, especially if you become short of breath, nauseous, vomiting with associated chest pain.

## 2022-02-25 NOTE — ED Provider Notes (Signed)
[facility] DEPT Provider Note   CSN: [id number] Arrival date & time: 02/25/22  1422     History  Chief Complaint  Patient presents with   Chest Pain    JACQUELYNE QUARRY is a 27 y.o. female with past medical history significant for acid reflux, PCOS, hyperlipidemia per patient not currently undergoing any treatment who presents with concern for sharp pain under her left breast that comes and goes for around 8 months, she reports that she had some today, she has some every day, no active chest pain time my evaluation.  Patient reports that she recently found out she was pregnant.  Reports that otherwise no change in the pain today compared to what has been ongoing for around 8 months.  Denies any exertional component of chest pain, denies worse with lying down, or after eating.  She denies significant stress.   Chest Pain      Home Medications Prior to Admission medications   Medication Sig Start Date End Date Taking? Authorizing Provider  acetaminophen (TYLENOL) 500 MG tablet Take 2 tablets (1,000 mg total) by mouth every 6 (six) hours as needed. 06/29/21   Talbot Grumbling, FNP  Adapalene-Benzoyl Peroxide (EPIDUO) 0.1-2.5 % gel APPLY A THIN LAYER ONCE NIGHTLY TO THE FACE, CHEST, AND BACK 10/10/17   Marrian Salvage, FNP  cetirizine (ZYRTEC) 10 MG tablet Take 1 tablet (10 mg total) by mouth daily. 06/29/21   Talbot Grumbling, FNP  fluticasone (FLONASE) 50 MCG/ACT nasal spray Place 1 spray into both nostrils daily. 06/29/21   Talbot Grumbling, FNP  omeprazole (PRILOSEC) 20 MG capsule Take 1 capsule (20 mg total) by mouth daily. 09/17/18   Marrian Salvage, FNP      Allergies    Other    Review of Systems   Review of Systems  Cardiovascular:  Positive for chest pain.  All other systems reviewed and are negative.   Physical Exam Updated Vital Signs BP 127/85 (BP Location: Left Arm)   Pulse 100   Temp 98.4 F (36.9 C) (Oral)    Resp 16   Ht 5\' 5"  (1.651 m)   Wt 98.9 kg   LMP 01/25/2022   SpO2 100%   BMI 36.28 kg/m  Physical Exam Vitals and nursing note reviewed.  Constitutional:      General: She is not in acute distress.    Appearance: Normal appearance.  HENT:     Head: Normocephalic and atraumatic.  Eyes:     General:        Right eye: No discharge.        Left eye: No discharge.  Cardiovascular:     Rate and Rhythm: Normal rate and regular rhythm.     Heart sounds: No murmur heard.    No friction rub. No gallop.  Pulmonary:     Effort: Pulmonary effort is normal.     Breath sounds: Normal breath sounds.  Abdominal:     General: Bowel sounds are normal.     Palpations: Abdomen is soft.  Skin:    General: Skin is warm and dry.     Capillary Refill: Capillary refill takes less than 2 seconds.  Neurological:     Mental Status: She is alert and oriented to person, place, and time.  Psychiatric:        Mood and Affect: Mood normal.        Behavior: Behavior normal.     ED Results / Procedures / Treatments  Labs (all labs ordered are listed, but only abnormal results are displayed) Labs Reviewed  CBC WITH DIFFERENTIAL/PLATELET  BASIC METABOLIC PANEL  HCG, SERUM, QUALITATIVE  TROPONIN I (HIGH SENSITIVITY)  TROPONIN I (HIGH SENSITIVITY)    EKG None  Radiology DG Chest 2 View  Result Date: 02/25/2022 CLINICAL DATA:  chest pain EXAM: CHEST - 2 VIEW COMPARISON:  None Available. FINDINGS: Cardiac silhouette is unremarkable. No pneumothorax or pleural effusion. The lungs are clear. The visualized skeletal structures are unremarkable. IMPRESSION: No acute cardiopulmonary process. Electronically Signed   By: Sammie Bench M.D.   On: 02/25/2022 16:14    Procedures Procedures    Medications Ordered in ED Medications - No data to display  ED Course/ Medical Decision Making/ A&P                             Medical Decision Making  This patient is a 27 y.o. female  who presents  to the ED for concern of chest pain.   Differential diagnoses prior to evaluation: The emergent differential diagnosis includes, but is not limited to,  ACS, AAS, PE, Mallory-Weiss, Boerhaave's, Pneumonia, acute bronchitis, asthma or COPD exacerbation, anxiety, MSK pain or traumatic injury to the chest, acid reflux versus other . This is not an exhaustive differential.   Past Medical History / Co-morbidities: HLD, current pregnancy  Physical Exam: Physical exam performed. The pertinent findings include: Patient well-appearing, borderline tachycardia, normal rhythm, she is stable vital signs, she is normotensive, stable oxygen saturation on room air  Lab Tests/Imaging studies: I personally interpreted labs/imaging and the pertinent results include: CBC, BMP, troponin unremarkable in context of ongoing chest pain for quite a long time, no active chest pain at this time. I independently interpreted chest xray which shows no acute intrathoracic abnormality. I agree with the radiologist interpretation.  Cardiac monitoring: EKG obtained and interpreted by my attending physician which shows: sinus tachycardia, borderline q wave, no evidence of acute ischemia    Disposition: After consideration of the diagnostic results and the patients response to treatment, I feel that patient's symptoms are somewhat consistent with possible stress versus MSK versus acid reflux, no evidence of cardiac or pulmonary abnormality or emergency today, encourage close PCP follow-up, do not think that patient needs urgent cardiac follow-up extremity medication today..   emergency department workup does not suggest an emergent condition requiring admission or immediate intervention beyond what has been performed at this time. The plan is: as above. The patient is safe for discharge and has been instructed to return immediately for worsening symptoms, change in symptoms or any other concerns.  Final Clinical Impression(s) /  ED Diagnoses Final diagnoses:  Atypical chest pain    Rx / DC Orders ED Discharge Orders     None         Dorien Chihuahua 02/25/22 Iago, Ankit, MD 02/26/22 1335

## 2022-02-25 NOTE — MAU Provider Note (Signed)
History   Chief Complaint:  HCG Level    Amanda Harper is a 27 y.o. G1P0 at [redacted]w[redacted]d who presents for labs. Reports continued lower abdominal cramping that she rates 1/10. Denies vaginal bleeding.  Physical Exam   Blood pressure 127/81, pulse 95, temperature 99 F (37.2 C), temperature source Oral, resp. rate 18, height 5\' 5"  (1.651 m), last menstrual period 01/25/2022, SpO2 100 %.  Physical Examination: General appearance - alert, well appearing, and in no distress Mental status - normal mood, behavior, speech, dress, motor activity, and thought processes Eyes - sclera anicteric Chest - normal respiratory effort  Labs: Results for orders placed or performed during the hospital encounter of 02/25/22 (from the past 24 hour(s))  hCG, quantitative, pregnancy   Collection Time: 02/25/22 12:28 PM  Result Value Ref Range   hCG, Beta Chain, Quant, S 1,159 (H) <5 mIU/mL    Ultrasound Studies:   No results found.  Assessment:   1. Abdominal cramping affecting pregnancy   2. Pregnancy of unknown anatomic location   3. [redacted] weeks gestation of pregnancy      -Appropriate rise in HCG today. Will HCG on Tuesday - if continues to rise appropriately, may consider viability scan in 2 weeks.   Plan: -Discharge home in stable condition -SAB precautions discussed -Patient advised to follow-up with for stat HCG at [facility] on Tuesday -Patient may return to MAU as needed or if her condition were to change or worsen  Jorje Guild, NP 02/25/2022, 2:16 PM

## 2022-02-28 ENCOUNTER — Other Ambulatory Visit (INDEPENDENT_AMBULATORY_CARE_PROVIDER_SITE_OTHER): Payer: BLUE CROSS/BLUE SHIELD | Admitting: *Deleted

## 2022-02-28 VITALS — BP 122/65 | HR 72 | Ht 65.5 in | Wt 216.4 lb

## 2022-02-28 DIAGNOSIS — Z3A01 Less than 8 weeks gestation of pregnancy: Secondary | ICD-10-CM | POA: Diagnosis not present

## 2022-02-28 DIAGNOSIS — O3680X0 Pregnancy with inconclusive fetal viability, not applicable or unspecified: Secondary | ICD-10-CM | POA: Diagnosis not present

## 2022-02-28 LAB — BETA HCG QUANT (REF LAB): hCG Quant: 3622 m[IU]/mL

## 2022-02-28 NOTE — Progress Notes (Addendum)
Here for stat bhcg. Today denies pain or vaginal bleeding. States only spotted the day she went to MAU on 02/22/22. Explained we will draw stat bhcg then she can leave and I will call her in a few hours with results and plan on care once reviewed with a provider. She voices understanding. Staci Acosta Results received =[id number]. Reviewed results and history with Dr. Dione Plover. Appropriate rise in bhcg, start prenatal care, [place] in 2 weeks. I called Hollice Gong  and informed her of results and recommendations- she has already scheduled prenatal appointments in our office. I scheduled [place] for first available for 03/16/22 at 9am. Patient voices understanding. Staci Acosta

## 2022-03-16 ENCOUNTER — Ambulatory Visit
Admission: RE | Admit: 2022-03-16 | Discharge: 2022-03-16 | Disposition: A | Discharge status: Home / Self Care | Payer: BLUE CROSS/BLUE SHIELD | Source: Ambulatory Visit | Attending: Family Medicine | Admitting: Family Medicine

## 2022-03-16 ENCOUNTER — Ambulatory Visit (INDEPENDENT_AMBULATORY_CARE_PROVIDER_SITE_OTHER): Payer: BLUE CROSS/BLUE SHIELD | Admitting: General Practice

## 2022-03-16 DIAGNOSIS — O3680X0 Pregnancy with inconclusive fetal viability, not applicable or unspecified: Secondary | ICD-10-CM

## 2022-03-16 DIAGNOSIS — Z3A00 Weeks of gestation of pregnancy not specified: Secondary | ICD-10-CM

## 2022-03-16 NOTE — Progress Notes (Signed)
Patient presents to office today for ultrasound results. Per Kerry Hough, gestational sac, yolk sac & embryo now visible on ultrasound but no cardiac activity yet. Should repeat ultrasound for viability in 10-14 days. Discussed with patient and scheduled ultrasound for 2/29 @ 1pm due to patient traveling out of the country. Advised patient to call us if she has questions or concerns and to be aware of nearest hospital when she travels in case of bleeding/severe pain.  Koren Bound RN BSN 03/16/22

## 2022-03-22 ENCOUNTER — Other Ambulatory Visit: Payer: Self-pay

## 2022-03-22 ENCOUNTER — Inpatient Hospital Stay (HOSPITAL_COMMUNITY): Payer: BLUE CROSS/BLUE SHIELD

## 2022-03-22 ENCOUNTER — Inpatient Hospital Stay (HOSPITAL_COMMUNITY)
Admission: AD | Admit: 2022-03-22 | Discharge: 2022-03-22 | Disposition: A | Discharge status: Home / Self Care | Payer: BLUE CROSS/BLUE SHIELD | Attending: Obstetrics & Gynecology | Admitting: Obstetrics & Gynecology

## 2022-03-22 DIAGNOSIS — O99281 Endocrine, nutritional and metabolic diseases complicating pregnancy, first trimester: Secondary | ICD-10-CM | POA: Insufficient documentation

## 2022-03-22 DIAGNOSIS — O99611 Diseases of the digestive system complicating pregnancy, first trimester: Secondary | ICD-10-CM | POA: Insufficient documentation

## 2022-03-22 DIAGNOSIS — O26891 Other specified pregnancy related conditions, first trimester: Secondary | ICD-10-CM | POA: Insufficient documentation

## 2022-03-22 DIAGNOSIS — R109 Unspecified abdominal pain: Secondary | ICD-10-CM | POA: Insufficient documentation

## 2022-03-22 DIAGNOSIS — E282 Polycystic ovarian syndrome: Secondary | ICD-10-CM | POA: Insufficient documentation

## 2022-03-22 DIAGNOSIS — O3680X0 Pregnancy with inconclusive fetal viability, not applicable or unspecified: Secondary | ICD-10-CM

## 2022-03-22 DIAGNOSIS — Z3A08 8 weeks gestation of pregnancy: Secondary | ICD-10-CM | POA: Diagnosis not present

## 2022-03-22 DIAGNOSIS — K219 Gastro-esophageal reflux disease without esophagitis: Secondary | ICD-10-CM | POA: Diagnosis not present

## 2022-03-22 LAB — URINALYSIS, ROUTINE W REFLEX MICROSCOPIC
Bilirubin Urine: NEGATIVE
Glucose, UA: NEGATIVE mg/dL
Hgb urine dipstick: NEGATIVE
Ketones, ur: NEGATIVE mg/dL
Leukocytes,Ua: NEGATIVE
Nitrite: NEGATIVE
Protein, ur: NEGATIVE mg/dL
Specific Gravity, Urine: 1.014 (ref 1.005–1.030)
pH: 7 (ref 5.0–8.0)

## 2022-03-22 NOTE — MAU Note (Signed)
.  Amanda Harper is a 27 y.o. at [redacted]w[redacted]d here in MAU reporting: Abdominal cramping that started last night. Pt reports she 2 episode of diarrhea over the last couple of days. Pt reports she is unsure if she has had vaginal bleeding on Monday she saw a pink hue on her toilet paper.   Onset of complaint: yesterday  Pain score: 7/10 There were no vitals filed for this visit.   Lab orders placed from triage:   ua

## 2022-03-22 NOTE — MAU Provider Note (Signed)
History     CSN: [id number]  Arrival date and time: 03/22/22 1317   Event Date/Time   First Provider Initiated Contact with Patient 03/22/22 1522      Chief Complaint  Patient presents with   Abdominal Pain   HPI Amanda Harper is a 27 y.o. G1P0 at [redacted]w[redacted]d who presents to MAU with chief complaint of abdominal cramping. This is a recurrent problem, for which patient was evaluated in MAU on 02/25/2022. Pain score was 1/10 at onset in January. Today is has intensified to 7/10. She denies aggravating or alleviating factors. She has not taken medication for this complaint and declines medication during MAU encounter.  Patient also reports uncertain pink-tinged discharge on 03/20/2022. She is not experiencing frank red vaginal bleeding.  Patient also reports hx of recurrent diarrhea who preceded worsening of her abdominal pain. Her diarrhea has since resolved without intervention.  OB History     Gravida  1   Para      Term      Preterm      AB      Living         SAB      IAB      Ectopic      Multiple      Live Births              Past Medical History:  Diagnosis Date   GERD (gastroesophageal reflux disease)    Osgood-Schlatter's disease of left knee    PCOS (polycystic ovarian syndrome)     Past Surgical History:  Procedure Laterality Date   NO PAST SURGERIES      Family History  Problem Relation Age of Onset   Hypertension Father    Hypertension Sister    Hypertension Maternal Grandmother    Hypertension Maternal Grandfather    Diabetes Maternal Grandfather    Hyperlipidemia Paternal Grandmother    Cancer Neg Hx    Alcohol abuse Neg Hx    Drug abuse Neg Hx    Early death Neg Hx    Heart disease Neg Hx    Kidney disease Neg Hx     Social History   Tobacco Use   Smoking status: Never   Smokeless tobacco: Never  Substance Use Topics   Alcohol use: No   Drug use: No    Allergies:  Allergies  Allergen Reactions   Other Rash    Henna  dye    No medications prior to admission.    Review of Systems  Gastrointestinal:  Positive for abdominal pain.  Genitourinary:  Positive for vaginal bleeding.  All other systems reviewed and are negative.  Physical Exam   Blood pressure 124/65, pulse 88, temperature 99.2 F (37.3 C), resp. rate 12, last menstrual period 01/25/2022, SpO2 100 %.  Physical Exam Vitals and nursing note reviewed.  Constitutional:      Appearance: She is well-developed. She is not ill-appearing.  Cardiovascular:     Rate and Rhythm: Normal rate.  Pulmonary:     Effort: Pulmonary effort is normal.  Abdominal:     General: Abdomen is flat.     Palpations: Abdomen is soft.     Tenderness: There is no abdominal tenderness.  Skin:    Capillary Refill: Capillary refill takes less than 2 seconds.  Neurological:     Mental Status: She is alert and oriented to person, place, and time.     MAU Course  Procedures  MDM -EMR reviewed: S/p formal  US on 03/16/2022, + FP with no cardiac activity. Scheduled for repeat viability ultrasound on 04/13/2022 - Unable to visualize GS on bedside ultrasound today. Formal imaging ordered. Preemptive discussion with patient and partner that she is only 6 days s/p previous ultrasound, likely to be identical reading, will potentially not be able to update diagnosis  Patient Vitals for the past 24 hrs:  BP Temp Pulse Resp SpO2  03/22/22 1357 124/65 99.2 F (37.3 C) 88 12 100 %   US OB Transvaginal  Result Date: 03/22/2022 CLINICAL DATA:  Vaginal bleeding EXAM: TRANSVAGINAL OB ULTRASOUND TECHNIQUE: Transvaginal ultrasound was performed for complete evaluation of the gestation as well as the maternal uterus, adnexal regions, and pelvic cul-de-sac. COMPARISON:  Ultrasound 03/16/2022 FINDINGS: Intrauterine gestational sac: Single intrauterine gestational sac Yolk sac:  Visualized Embryo:  Visualized Cardiac Activity: Not visualized CRL:   4.9 mm   6 w 1 d                  [place]  EDC: 11/14/2022 Subchorionic hemorrhage:  None visualized. Maternal uterus/adnexae: Ovaries are within normal limits. Left ovary measures 2.1 x 1.3 by 2.4 cm. The right ovary measures 3.4 x 1.8 by 3.5 cm. Corpus luteum in the right ovary. Trace free fluid. IMPRESSION: Redemonstrated single IUP with visualized embryo but no fetal cardiac activity, suspicious for fetal demise/failed pregnancy. Electronically Signed   By: Donavan Foil M.D.   On: 03/22/2022 15:17     Assessment and Plan  --27 y.o. G1P0 at [redacted]w[redacted]d  --Concern for uncertain fetal viability --Miscarriage and bleeding precautions reviewed --Discharge home in stable condition  F/U: --Patient to attend previously scheduled repeat viability [place] on 04/13/2022  Darlina Rumpf, [place], MSN, CNM 03/22/2022, 4:12 PM

## 2022-03-30 ENCOUNTER — Ambulatory Visit: Payer: BLUE CROSS/BLUE SHIELD

## 2022-04-13 ENCOUNTER — Ambulatory Visit: Payer: BLUE CROSS/BLUE SHIELD

## 2022-04-18 ENCOUNTER — Telehealth: Payer: Self-pay | Admitting: *Deleted

## 2022-04-18 DIAGNOSIS — O3680X0 Pregnancy with inconclusive fetal viability, not applicable or unspecified: Secondary | ICD-10-CM

## 2022-04-18 NOTE — Telephone Encounter (Signed)
Patient [facility] New ob intake, per chart review [facility] viability [place]. Rescheduled viability [place] for 04/20/22 '@0800'$ , arrive 0730 with full bladder. I called Hollice Gong and left message about this [place] appointment and also that she missed 0815 appt today which we will reschedule after [place] appt. Call if questions. Staci Acosta

## 2022-04-20 ENCOUNTER — Ambulatory Visit: Admission: RE | Admit: 2022-04-20 | Payer: BLUE CROSS/BLUE SHIELD | Source: Ambulatory Visit

## 2022-04-24 ENCOUNTER — Telehealth: Payer: Self-pay | Admitting: Family Medicine

## 2022-04-24 NOTE — Telephone Encounter (Signed)
Patient would like to speak to someone regarding an SAB

## 2022-04-25 ENCOUNTER — Encounter: Payer: Self-pay | Admitting: Obstetrics and Gynecology

## 2022-04-26 NOTE — Telephone Encounter (Signed)
Called pt and pt stated that she was oversees and miscarried.  I ask pt if she was seen by a provider while out of the country pt stated no and that she just had a big clot to come out.  I advised pt to go to MAU for evaluation as she would need to confirmation.   I advised pt that at MAU is when they will let her know her next steps.  Pt verbalized understanding with no further questions.   Frances Nickels  04/26/22

## 2022-05-02 ENCOUNTER — Other Ambulatory Visit: Payer: Self-pay

## 2022-05-02 ENCOUNTER — Inpatient Hospital Stay (HOSPITAL_COMMUNITY)
Admission: AD | Admit: 2022-05-02 | Discharge: 2022-05-02 | Disposition: A | Discharge status: Home / Self Care | Payer: BLUE CROSS/BLUE SHIELD | Attending: Family Medicine | Admitting: Family Medicine

## 2022-05-02 DIAGNOSIS — O039 Complete or unspecified spontaneous abortion without complication: Secondary | ICD-10-CM | POA: Diagnosis present

## 2022-05-02 DIAGNOSIS — Z3A13 13 weeks gestation of pregnancy: Secondary | ICD-10-CM | POA: Diagnosis not present

## 2022-05-02 DIAGNOSIS — Z711 Person with feared health complaint in whom no diagnosis is made: Secondary | ICD-10-CM

## 2022-05-02 DIAGNOSIS — Z789 Other specified health status: Secondary | ICD-10-CM | POA: Diagnosis not present

## 2022-05-02 DIAGNOSIS — L700 Acne vulgaris: Secondary | ICD-10-CM

## 2022-05-02 DIAGNOSIS — L709 Acne, unspecified: Secondary | ICD-10-CM

## 2022-05-02 LAB — POCT PREGNANCY, URINE: Preg Test, Ur: NEGATIVE

## 2022-05-02 NOTE — MAU Note (Signed)
Amanda Harper is a 27 y.o. at [redacted]w[redacted]d here in MAU reporting: she wants to confirm if she's had a miscarriage. Reports began spotting on 03/22/2022 and heavy VB began on 04/03/2022 and possibly passed POC on 04/12/22.  Reports had severe cramping and 1 after possibly passing POC, cramping stopped. Pt showed RN a picture of what she believes was the pregnancy.  RN unsure if POC or blood clots. Reports hasn't been evaluated by a provider because she was out of the country. Denies current VB or pian LMP: NA Onset of complaint: 03/22/2022 Pain score: 0 There were no vitals filed for this visit.   FHT:NA Lab orders placed from triage:  NA

## 2022-05-02 NOTE — MAU Provider Note (Signed)
Event Date/Time   First Provider Initiated Contact with Patient 05/02/22 1420      S [name] is a 27 y.o. G1P0 patient who presents to MAU today without complaints. Patient went out of the country in first part of Feb. She noted some spotting starting on February 7th and lasted Until Feb 14. Followed by 3 days of No bleeding. On Feb 17 patient reports heavy bleeding and intense period like cramping. She reports on Feb 23 she passed what appears to be Products of Conception (based on cell phone imaging.) She notes that bleeding lightened up and by Feb 29th both bleeding and cramping ceased. She got back into the country on March 5 and presents today to know if still pregnant.   O BP 120/68 (BP Location: Right Arm)   Pulse 83   Temp 98.6 F (37 C) (Oral)   Resp 18   LMP 01/25/2022   SpO2 100%  Physical Exam Vitals and nursing note reviewed.  Constitutional:      General: She is not in acute distress.    Appearance: Normal appearance.  HENT:     Head: Normocephalic.  Pulmonary:     Effort: Pulmonary effort is normal.  Musculoskeletal:     Cervical back: Normal range of motion.  Skin:    General: Skin is warm and dry.  Neurological:     Mental Status: She is alert and oriented to person, place, and time.  Psychiatric:        Mood and Affect: Mood normal.     A Medical screening exam complete Orders Placed This Encounter  Procedures   Pregnancy, urine POC   Discharge patient   Results for orders placed or performed during the hospital encounter of 05/02/22 (from the past 24 hour(s))  Pregnancy, urine POC     Status: None   Collection Time: 05/02/22  1:37 PM  Result Value Ref Range   Preg Test, Ur NEGATIVE NEGATIVE   - Negative UPT today in MAU.  - No bleeding or Pain at this time.  - High suspicion for complete miscarriage.  - Plan for discharge.   P 1. Not currently pregnant   2. [redacted] weeks gestation of pregnancy   3. Acne vulgaris   4. Acne,  unspecified acne type   5. Physically well but worried   6. SAB (spontaneous abortion)    - Reviewed that based on patient presentation and Neg UPT this is likely a SAB.  - Patient appropriately tearful  - Discussed the option for SAB follow up outpatient. Patient agreeable to plan of care. Message sent to [facility] for SAB follow up.  - Briefly discussed ovulation and preconception.  - Discharge from MAU in stable condition - List of options for follow-up given  - Patient may return to MAU as needed   Deloris Ping, [place] 05/02/2022 2:20 PM

## 2022-05-17 ENCOUNTER — Encounter (HOSPITAL_COMMUNITY): Payer: Self-pay | Admitting: Obstetrics and Gynecology

## 2022-05-17 ENCOUNTER — Inpatient Hospital Stay (HOSPITAL_COMMUNITY)
Admission: AD | Admit: 2022-05-17 | Discharge: 2022-05-17 | Disposition: A | Discharge status: Home / Self Care | Payer: Medicaid Other | Attending: Obstetrics and Gynecology | Admitting: Obstetrics and Gynecology

## 2022-05-17 DIAGNOSIS — Z711 Person with feared health complaint in whom no diagnosis is made: Secondary | ICD-10-CM | POA: Diagnosis not present

## 2022-05-17 DIAGNOSIS — Z3201 Encounter for pregnancy test, result positive: Secondary | ICD-10-CM | POA: Diagnosis not present

## 2022-05-17 HISTORY — DX: Acne, unspecified: L70.9

## 2022-05-17 LAB — POCT PREGNANCY, URINE: Preg Test, Ur: POSITIVE — AB

## 2022-05-17 NOTE — Discharge Instructions (Signed)

## 2022-05-17 NOTE — MAU Provider Note (Signed)
Event Date/Time   First Provider Initiated Contact with Patient 05/17/22 1624      S [name] is a 27 y.o. G1P0010 patient who presents to MAU today with complaint of possible pregnancy. She reports feeling very nervous because she recently had a loss and does not know how far along she is. She has been fasting for Ramadan and wants to be sure that she didn't cause any issue. She denies any pain or bleeding.   O BP 121/71 (BP Location: Right Arm)   Pulse 82   Temp 99.3 F (37.4 C) (Oral)   Resp 16   Ht 5\' 5"  (1.651 m)   Wt 96 kg   SpO2 100%   BMI 35.21 kg/m  Physical Exam Vitals and nursing note reviewed.  Constitutional:      General: She is not in acute distress.    Appearance: She is well-developed.  HENT:     Head: Normocephalic.  Eyes:     Pupils: Pupils are equal, round, and reactive to light.  Cardiovascular:     Rate and Rhythm: Normal rate and regular rhythm.     Heart sounds: Normal heart sounds.  Pulmonary:     Effort: Pulmonary effort is normal. No respiratory distress.     Breath sounds: Normal breath sounds.  Abdominal:     General: Bowel sounds are normal. There is no distension.     Palpations: Abdomen is soft.     Tenderness: There is no abdominal tenderness.  Skin:    General: Skin is warm and dry.  Neurological:     Mental Status: She is alert and oriented to person, place, and time.  Psychiatric:        Mood and Affect: Mood normal.        Behavior: Behavior normal.        Thought Content: Thought content normal.        Judgment: Judgment normal.     A Medical screening exam complete 1. Positive pregnancy test   2. Physically well but worried    -Viability appointment made -Discussed options for fasting during Ramadan. Patient states she has broken her fast due to pregnancy and does not intend to continue fasting  P Discharge from MAU in stable condition Patient given the option of transfer to [facility] for further evaluation or  seek care in outpatient facility of choice  List of options for follow-up given  Warning signs for worsening condition that would warrant emergency follow-up discussed Patient may return to MAU as needed   Wende Mott, [place] 05/17/2022 4:24 PM

## 2022-05-17 NOTE — MAU Note (Signed)
Amanda Harper is a 27 y.o. here in MAU reporting: +HPT today.  Just had a miscarriage in Feb. No pain or bleeding. Wanting confirmation. LMP: bled 2/7-2/29 with miscarriage, no bleeding since Onset of complaint: no complaint Pain score: none Vitals:   05/17/22 1609  BP: 121/71  Pulse: 82  Resp: 16  Temp: 99.3 F (37.4 C)  SpO2: 100%      Lab orders placed from triage:  UPT

## 2022-05-22 ENCOUNTER — Ambulatory Visit: Payer: BLUE CROSS/BLUE SHIELD | Admitting: Medical

## 2022-06-06 ENCOUNTER — Ambulatory Visit (INDEPENDENT_AMBULATORY_CARE_PROVIDER_SITE_OTHER): Payer: Medicaid Other | Admitting: Advanced Practice Midwife

## 2022-06-06 ENCOUNTER — Other Ambulatory Visit: Payer: Self-pay

## 2022-06-06 ENCOUNTER — Ambulatory Visit (INDEPENDENT_AMBULATORY_CARE_PROVIDER_SITE_OTHER): Payer: Medicaid Other

## 2022-06-06 DIAGNOSIS — Z3482 Encounter for supervision of other normal pregnancy, second trimester: Secondary | ICD-10-CM

## 2022-06-06 DIAGNOSIS — Z3491 Encounter for supervision of normal pregnancy, unspecified, first trimester: Secondary | ICD-10-CM | POA: Diagnosis not present

## 2022-06-06 DIAGNOSIS — O3680X0 Pregnancy with inconclusive fetal viability, not applicable or unspecified: Secondary | ICD-10-CM

## 2022-06-06 DIAGNOSIS — Z3A08 8 weeks gestation of pregnancy: Secondary | ICD-10-CM | POA: Diagnosis not present

## 2022-06-06 DIAGNOSIS — Z3A14 14 weeks gestation of pregnancy: Secondary | ICD-10-CM

## 2022-06-06 NOTE — Patient Instructions (Addendum)
[facility] Providers   [facility]             [address], [place], [place] [address] [phone]  [facility]                                                             [address], [address], [place], [place], [address] [phone]  [facility]                                    [address], [address], [place], [place], [address] [phone]  [facility] [place] [address], [address], [place], [place], [address] [phone]  [facility]                                 [address], [place], [place], [address] [phone]  [facility]                                    [address], [place], [place], [address] [phone]  [facility] [address], [address], [place], [place], [address]                              [facility] [address], [address], [place], [place], [address] [phone]  [facility]         Phone: [phone]  [facility]      Phone: [phone]   [facility]      Phone: [phone]  [facility]         Phone: [phone]   [facility]    Phone: [phone]  [facility]      Phone: [phone]  [facility]     Phone: [phone]  [facility]        Phone: [phone]  [facility] (Sheronette Cousins) [phone]  [facility]      Phone: [phone]    [facility]eringPregnancy is a model of prenatal care that started 30 years ago and is used in about 600 practices around the [place]. You meet with a group of 8-12 women due around the  same time as you. In [facility] you will have individual time with the provider and meet as a group. There's much more time for discussion and learning. You will actually have much more time with your provider in [facility] than in traditional prenatal care.? You will come directly into the [facility] room and will not wait in the lobby so there is no wasted time. You will have 2-hour visits every 4 weeks then every 2 weeks. You will know your [facility] prenatal appointments in advance. In your last month of pregnancy, you may also come in for some   visits. Additional appointments can be scheduled if you need more care. Studies have shown that [facility]Pregnancy improves birth outcomes. We have seen especially big improvements in fewer Black women delivering babies who are too small or born too early. Visit the website [facility] for more information. Let your provider or clinic staff know if you want to sign up or email [email or web address] for more information.   [facility]regnancy Video  

## 2022-06-06 NOTE — Progress Notes (Signed)
Ultrasounds Results Note  SUBJECTIVE HPI:  [name] is a 27 y.o. G2P0010 at [redacted]w[redacted]d by  last miscarriage (doesn't think she had a period in between miscarriage and this pregnancy)  who presents to [facility] for followup ultrasound results. The patient reports mild LLQ pain abdominal pain. Denies vaginal bleeding.  Upon review of the patient's records, patient was first seen in MAU on 05/17/22 for concern about pregnancy after SAB. No [place] or hCG indicated. Here for dating and viability [place].     --/--/O POS (01/10 2007)  Repeat ultrasound was performed earlier today.   Past Medical History:  Diagnosis Date   Acne    GERD (gastroesophageal reflux disease)    Osgood-Schlatter's disease of left knee    PCOS (polycystic ovarian syndrome)    PCOS (polycystic ovarian syndrome)    Past Surgical History:  Procedure Laterality Date   NO PAST SURGERIES     Social History   Socioeconomic History   Marital status: Married    Spouse name: Not on file   Number of children: Not on file   Years of education: Not on file   Highest education level: Not on file  Occupational History   Not on file  Tobacco Use   Smoking status: Never   Smokeless tobacco: Never  Vaping Use   Vaping Use: Never used  Substance and Sexual Activity   Alcohol use: No   Drug use: No   Sexual activity: Yes    Birth control/protection: Pill  Other Topics Concern   Not on file  Social History Narrative   Not on file   Social Determinants of Health   Financial Resource Strain: Not on file  Food Insecurity: Not on file  Transportation Needs: Not on file  Physical Activity: Not on file  Stress: Not on file  Social Connections: Not on file  Intimate Partner Violence: Not on file   Current Outpatient Medications on File Prior to Visit  Medication Sig Dispense Refill   acetaminophen (TYLENOL) 500 MG tablet Take 2 tablets (1,000 mg total) by mouth every 6 (six) hours as needed. 30 tablet 0    cetirizine (ZYRTEC) 10 MG tablet Take 1 tablet (10 mg total) by mouth daily. 30 tablet 2   fluticasone (FLONASE) 50 MCG/ACT nasal spray Place 1 spray into both nostrils daily. 16 g 2   omeprazole (PRILOSEC) 20 MG capsule Take 1 capsule (20 mg total) by mouth daily. 30 capsule 0   Prenatal Vit-Fe Fumarate-FA (PRENATAL VITAMIN PO) Take 1 tablet by mouth daily.     No current facility-administered medications on file prior to visit.   Allergies  Allergen Reactions   Metformin And Related     dizzy   Pork-Derived Products     religious   Other Rash    Henna dye    I have reviewed patient's Past Medical Hx, Surgical Hx, Family Hx, Social Hx, medications and allergies.   Review of Systems Review of Systems  Constitutional: Negative for fever and chills.  Gastrointestinal: Negative for abdominal pain.  Genitourinary: Negative for vaginal bleeding.  Musculoskeletal: Negative for back pain.  Neurological: Negative for dizziness and weakness.    Physical Exam  LMP 04/13/2022 (Approximate) Comment: no period since miscarriage,  bled 2/7-2/29  Patient's last menstrual period was 04/13/2022 (approximate). GENERAL: Well-developed, well-nourished female in no acute distress.  HEENT: Normocephalic, atraumatic.   LUNGS: Effort normal ABDOMEN: Deferred HEART: Regular rate  SKIN: Warm, dry and without erythema  PSYCH: Normal mood and affect NEURO: Alert and oriented x 4  LAB RESULTS No results found for this or any previous visit (from the past 24 hour(s)).  IMAGING 8.2 week live SIUP  ASSESSMENT 1. [redacted] weeks gestation of pregnancy   2. Normal IUP (intrauterine pregnancy) on prenatal ultrasound, first trimester   3. Pregnancy with inconclusive fetal viability, single or unspecified fetus     PLAN Start prenatal care with provider of your choice Will Use EDD 01/14/23 by [place] due to last bleeding not being a period.  List of Ob/Gyn providers given. Plans to come to [facility]. Patient advised  to continue taking prenatal vitamins Go to MAU as needed for heavy bleeding, abdominal pain or fever greater than 100.4.  [place], CNM 06/06/2022 11:09 AM

## 2022-06-10 ENCOUNTER — Encounter: Payer: Self-pay | Admitting: Advanced Practice Midwife

## 2022-07-06 ENCOUNTER — Telehealth (INDEPENDENT_AMBULATORY_CARE_PROVIDER_SITE_OTHER): Payer: Medicaid Other

## 2022-07-06 DIAGNOSIS — Z3480 Encounter for supervision of other normal pregnancy, unspecified trimester: Secondary | ICD-10-CM | POA: Insufficient documentation

## 2022-07-06 NOTE — Progress Notes (Signed)
New OB Intake  I connected with Amanda Harper  on 07/06/22 at  1:15 PM EDT by MyChart Video Visit and verified that I am speaking with the correct person using two identifiers. Nurse is located at [facility] and pt is located at home.  I discussed the limitations, risks, security and privacy concerns of performing an evaluation and management service by telephone and the availability of in person appointments. I also discussed with the patient that there may be a patient responsible charge related to this service. The patient expressed understanding and agreed to proceed.  I explained I am completing New OB Intake today. We discussed EDD of 01/14/23 that is based on [place] on 06/06/22. Pt is G2/P0. I reviewed her allergies, medications, Medical/Surgical/OB history, and appropriate screenings. I informed her of [facility] services. [facility] information placed in AVS. Based on history, this is a low risk pregnancy.  Patient Active Problem List   Diagnosis Date Noted   Supervision of other normal pregnancy, antepartum 07/06/2022    Concerns addressed today  Delivery Plans Plans to deliver at [facility]. Patient given information for [facility] Healthy Baby website for more information about [facility]. Patient is not interested in water birth. Offered upcoming OB visit with CNM to discuss further.  MyChart/Babyscripts MyChart access verified. I explained pt will have some visits in office and some virtually. Babyscripts instructions given and order placed. Patient verifies receipt of registration text/e-mail. Account successfully created and app downloaded.  Blood Pressure Cuff/Weight Scale Pt has own BP Cuff, Explained after first prenatal appt pt will check weekly and document in Babyscripts. Patient does have weight scale.  Anatomy US Explained first scheduled Korea will be around 19 weeks. Anatomy US scheduled for 08/22/22 at 0130p. Pt notified to arrive at 0115p.  Labs Discussed Avelina Laine genetic  screening with patient. Would like both Panorama and Horizon drawn at new OB visit. Routine prenatal labs needed.  COVID Vaccine Patient has had COVID vaccine.   Is patient a CenteringPregnancy candidate?  Offer at [facility]       Is patient a Mom+Baby Combined Care candidate?  Not a candidate   If accepted, Mom+Baby staff notified  Social Determinants of Health Food Insecurity: Patient denies food insecurity. WIC Referral: Patient is interested in referral to [facility].  Transportation: Patient denies transportation needs. Childcare: Discussed no children allowed at ultrasound appointments. Offered childcare services; patient declines childcare services at this time.  Interested in [place]? If yes, send referral and doula dot phrase.   First visit review I reviewed new OB appt with patient. Explained pt will be seen by Albertine Grates, CNM at first visit; encounter routed to appropriate provider. Explained that patient will be seen by pregnancy navigator following visit with provider.   Henrietta Dine, CMA 07/06/2022  2:17 PM

## 2022-07-06 NOTE — Patient Instructions (Signed)

## 2022-07-13 ENCOUNTER — Other Ambulatory Visit: Payer: Self-pay

## 2022-07-13 ENCOUNTER — Encounter: Payer: Medicaid Other | Admitting: Obstetrics and Gynecology

## 2022-07-13 ENCOUNTER — Other Ambulatory Visit (HOSPITAL_COMMUNITY)
Admission: RE | Admit: 2022-07-13 | Discharge: 2022-07-13 | Disposition: A | Discharge status: Home / Self Care | Payer: Medicaid Other | Source: Ambulatory Visit | Attending: Obstetrics and Gynecology | Admitting: Obstetrics and Gynecology

## 2022-07-13 ENCOUNTER — Ambulatory Visit (INDEPENDENT_AMBULATORY_CARE_PROVIDER_SITE_OTHER): Payer: Medicaid Other | Admitting: Obstetrics and Gynecology

## 2022-07-13 VITALS — BP 109/69 | HR 73 | Wt 211.5 lb

## 2022-07-13 DIAGNOSIS — Z3480 Encounter for supervision of other normal pregnancy, unspecified trimester: Secondary | ICD-10-CM | POA: Insufficient documentation

## 2022-07-13 DIAGNOSIS — Z3A13 13 weeks gestation of pregnancy: Secondary | ICD-10-CM

## 2022-07-13 DIAGNOSIS — Z3481 Encounter for supervision of other normal pregnancy, first trimester: Secondary | ICD-10-CM | POA: Diagnosis not present

## 2022-07-13 MED ORDER — ASPIRIN 81 MG PO TBEC: 81.0000 mg | DELAYED_RELEASE_TABLET | Freq: Every day | ORAL | 2 refills | Status: DC

## 2022-07-13 NOTE — Progress Notes (Signed)
INITIAL PRENATAL VISIT  Subjective:   Amanda Harper is being seen today for her first obstetrical visit.  This is a planned pregnancy. This is a desired pregnancy.  She is at [redacted]w[redacted]d gestation by u/s. Her obstetrical history is significant for  n/a . Relationship with FOB: spouse, living together. Patient does intend to breast feed. Pregnancy history fully reviewed.  Patient reports no complaints.  Indications for ASA therapy (per uptodate) One of the following: Previous pregnancy with preeclampsia, especially early onset and with an adverse outcome No Multifetal gestation No Chronic hypertension No Type 1 or 2 diabetes mellitus No Chronic kidney disease No Autoimmune disease (antiphospholipid syndrome, systemic lupus erythematosus) No  Two or more of the following: Nulliparity Yes Obesity (body mass index >30 kg/m2) No Family history of preeclampsia in mother or sister No Age ?35 years No Sociodemographic characteristics (African American race, low socioeconomic level) Yes Personal risk factors (eg, previous pregnancy with low birth weight or small for gestational age infant, previous adverse pregnancy outcome [eg, stillbirth], interval >10 years between pregnancies) No  Indications for early GDM screening  First-degree relative with diabetes Yes BMI >30kg/m2 Yes Age > 25 Yes Previous birth of an infant weighing ?4000 g No Gestational diabetes mellitus in a previous pregnancy No Glycated hemoglobin ?5.7 percent (39 mmol/mol), impaired glucose tolerance, or impaired fasting glucose on previous testing No High-risk race/ethnicity (eg, African American, Latino, Native American, Asian American, Pacific Islander) Yes Previous stillbirth of unknown cause No Maternal birthweight > 9 lbs No History of cardiovascular disease No Hypertension or on therapy for hypertension No High-density lipoprotein cholesterol level <35 mg/dL (1.61 mmol/L) and/or a triglyceride level >250 mg/dL (0.96  mmol/L) No Polycystic ovary syndrome Yes Physical inactivity No Other clinical condition associated with insulin resistance (eg, severe obesity, acanthosis nigricans) No Current use of glucocorticoids No   Early screening tests: FBS, A1C, Random CBG, glucose challenge   Review of Systems:   Review of Systems  Objective:    Obstetric History OB History  Gravida Para Term Preterm AB Living  2       1    SAB IAB Ectopic Multiple Live Births  1            # Outcome Date GA Lbr Len/2nd Weight Sex Delivery Anes PTL Lv  2 Current           1 SAB 03/2022     SAB       Past Medical History:  Diagnosis Date   Acne    GERD (gastroesophageal reflux disease)    Helicobacter pylori gastritis 07/11/2011   Noise-induced hearing loss of both ears 11/15/2015   Osgood-Schlatter's disease of left knee    Patella alta 11/05/2017   PCOS (polycystic ovarian syndrome)    PCOS (polycystic ovarian syndrome)     Past Surgical History:  Procedure Laterality Date   NO PAST SURGERIES      Current Outpatient Medications on File Prior to Visit  Medication Sig Dispense Refill   acetaminophen (TYLENOL) 500 MG tablet Take 2 tablets (1,000 mg total) by mouth every 6 (six) hours as needed. 30 tablet 0   omeprazole (PRILOSEC) 20 MG capsule Take 1 capsule (20 mg total) by mouth daily. 30 capsule 0   Prenatal Vit-Fe Fumarate-FA (PRENATAL VITAMIN PO) Take 1 tablet by mouth daily.     No current facility-administered medications on file prior to visit.    Allergies  Allergen Reactions   Metformin And Related  dizzy   Pork-Derived Products     religious   Other Rash    Henna dye    Social History:  reports that she has never smoked. She has never used smokeless tobacco. She reports that she does not drink alcohol and does not use drugs.  Family History  Problem Relation Age of Onset   Arthritis Mother    Arthritis Father    Heart disease Father    Hypertension Father    Hypertension  Sister    Hypertension Maternal Grandmother    Hypertension Maternal Grandfather    Diabetes Maternal Grandfather    Hyperlipidemia Paternal Grandmother    Cancer Neg Hx    Alcohol abuse Neg Hx    Drug abuse Neg Hx    Early death Neg Hx    Kidney disease Neg Hx     The following portions of the patient's history were reviewed and updated as appropriate: allergies, current medications, past family history, past medical history, past social history, past surgical history and problem list.  Review of Systems Review of Systems    Physical Exam:  BP 109/69   Pulse 73   Wt 211 lb 8 oz (95.9 kg)   LMP  (Approximate) Comment: no period since miscarriage,  bled 2/7-2/29  BMI 35.20 kg/m  CONSTITUTIONAL: Well-developed, well-nourished female in no acute distress.  HENT:  Normocephalic, atraumatic.   EYES: Conjunctivae normal.  NECK: Normal range of motion  SKIN: Skin is warm and dry. MUSCULOSKELETAL: Normal range of motion.   NEUROLOGIC: Alert and oriented PSYCHIATRIC: Normal mood and affect. Normal behavior. Normal judgment and thought content. RESPIRATORY: normal effort ABDOMEN: Soft PELVIC: deferred  Fetal Heart Rate (bpm): 145   Movement: Absent       Assessment/plan:    Pregnancy: G2P0010  1. Supervision of other normal pregnancy, antepartum BP and FHR normal Doing well overall, nausea has subsided Initial labs drawn today Continue prenatal vitamins. Start ASA Problem list reviewed and updated. Reviewed in detail the nature of the practice with collaborative care between  Genetic screening discussed: NIPS/First trimester screen/Quad/AFP ordered. Role of ultrasound in pregnancy discussed; Anatomy US: ordered.  Discussed clinic routines, schedule of care and testing, genetic screening options, involvement of students and residents under the direct supervision of APPs and doctors and presence of female providers. Pt verbalized understanding.   -  CBC/D/Plt+RPR+Rh+ABO+RubIgG... - HgB A1c - Culture, OB Urine - PANORAMA PRENATAL TEST - HORIZON Basic Panel - Cervicovaginal ancillary only( [place])  2. [redacted] weeks gestation of pregnancy Routine OB care  Return in 4 weeks for routine prenatal  Future Appointments  Date Time Provider Department Center  08/10/2022  8:15 AM Sue Lush, FNP [facility]  08/22/2022  1:15 PM [facility] NURSE [facility]  08/22/2022  1:30 PM [facility] US3 [facility]   Sue Lush, FNP

## 2022-07-13 NOTE — Patient Instructions (Signed)

## 2022-07-14 ENCOUNTER — Encounter: Payer: Self-pay | Admitting: *Deleted

## 2022-07-14 LAB — CBC/D/PLT+RPR+RH+ABO+RUBIGG...
Antibody Screen: NEGATIVE
Basophils Absolute: 0 10*3/uL (ref 0.0–0.2)
Basos: 0 %
EOS (ABSOLUTE): 0.1 10*3/uL (ref 0.0–0.4)
Eos: 1 %
HCV Ab: NONREACTIVE
HIV Screen 4th Generation wRfx: NONREACTIVE
Hematocrit: 41.1 % (ref 34.0–46.6)
Hemoglobin: 13.6 g/dL (ref 11.1–15.9)
Hepatitis B Surface Ag: NEGATIVE
Immature Grans (Abs): 0 10*3/uL (ref 0.0–0.1)
Immature Granulocytes: 0 %
Lymphocytes Absolute: 2.4 10*3/uL (ref 0.7–3.1)
Lymphs: 44 %
MCH: 26.1 pg — ABNORMAL LOW (ref 26.6–33.0)
MCHC: 33.1 g/dL (ref 31.5–35.7)
MCV: 79 fL (ref 79–97)
Monocytes Absolute: 0.4 10*3/uL (ref 0.1–0.9)
Monocytes: 7 %
Neutrophils Absolute: 2.5 10*3/uL (ref 1.4–7.0)
Neutrophils: 48 %
Platelets: 251 10*3/uL (ref 150–450)
RBC: 5.21 x10E6/uL (ref 3.77–5.28)
RDW: 12.5 % (ref 11.7–15.4)
RPR Ser Ql: NONREACTIVE
Rh Factor: POSITIVE
Rubella Antibodies, IGG: 1.66 index (ref >0.99)
WBC: 5.3 10*3/uL (ref 3.4–10.8)

## 2022-07-14 LAB — HCV INTERPRETATION

## 2022-07-14 LAB — HEMOGLOBIN A1C
Est. average glucose Bld gHb Est-mCnc: 97 mg/dL
Hgb A1c MFr Bld: 5 % (ref 4.8–5.6)

## 2022-07-14 LAB — CERVICOVAGINAL ANCILLARY ONLY
Chlamydia: NEGATIVE
Comment: NEGATIVE
Comment: NEGATIVE
Comment: NORMAL
Neisseria Gonorrhea: NEGATIVE
Trichomonas: NEGATIVE

## 2022-07-15 LAB — URINE CULTURE, OB REFLEX

## 2022-07-15 LAB — CULTURE, OB URINE

## 2022-07-20 LAB — PANORAMA PRENATAL TEST FULL PANEL:PANORAMA TEST PLUS 5 ADDITIONAL MICRODELETIONS: FETAL FRACTION: 7.3

## 2022-07-24 LAB — HORIZON CUSTOM: REPORT SUMMARY: POSITIVE — AB

## 2022-07-25 ENCOUNTER — Encounter: Payer: Self-pay | Admitting: Obstetrics and Gynecology

## 2022-07-25 DIAGNOSIS — D563 Thalassemia minor: Secondary | ICD-10-CM | POA: Insufficient documentation

## 2022-08-03 ENCOUNTER — Encounter: Payer: Self-pay | Admitting: General Practice

## 2022-08-10 ENCOUNTER — Encounter: Payer: Self-pay | Admitting: Obstetrics and Gynecology

## 2022-08-10 ENCOUNTER — Other Ambulatory Visit: Payer: Self-pay

## 2022-08-10 ENCOUNTER — Ambulatory Visit (INDEPENDENT_AMBULATORY_CARE_PROVIDER_SITE_OTHER): Payer: Medicaid Other | Admitting: Obstetrics and Gynecology

## 2022-08-10 VITALS — BP 114/67 | HR 80 | Wt 219.9 lb

## 2022-08-10 DIAGNOSIS — Z3402 Encounter for supervision of normal first pregnancy, second trimester: Secondary | ICD-10-CM

## 2022-08-10 DIAGNOSIS — K219 Gastro-esophageal reflux disease without esophagitis: Secondary | ICD-10-CM

## 2022-08-10 DIAGNOSIS — Z3A17 17 weeks gestation of pregnancy: Secondary | ICD-10-CM

## 2022-08-10 MED ORDER — OMEPRAZOLE 20 MG PO CPDR: 20.0000 mg | DELAYED_RELEASE_CAPSULE | Freq: Every day | ORAL | 3 refills | Status: DC

## 2022-08-10 MED ORDER — PREPLUS 27-1 MG PO TABS
1.0000 | ORAL_TABLET | Freq: Every day | ORAL | 0 refills | Status: DC
Start: 2022-08-10 — End: 2022-09-03

## 2022-08-10 NOTE — Progress Notes (Signed)
   PRENATAL VISIT NOTE  Subjective:  Amanda Harper is a 27 y.o. G2P0010 at [redacted]w[redacted]d being seen today for ongoing prenatal care.  She is currently monitored for the following issues for this low-risk pregnancy and has Supervision of other normal pregnancy, antepartum; Polycystic disease, ovaries; and Alpha thalassemia silent carrier on their problem list.  Patient reports no complaints.  Contractions: Not present. Vag. Bleeding: None.  Movement: Absent. Denies leaking of fluid.   The following portions of the patient's history were reviewed and updated as appropriate: allergies, current medications, past family history, past medical history, past social history, past surgical history and problem list.   Objective:   Vitals:   08/10/22 0819  BP: 114/67  Pulse: 80  Weight: 219 lb 14.4 oz (99.7 kg)    Fetal Status: Fetal Heart Rate (bpm): 142   Movement: Absent     General:  Alert, oriented and cooperative. Patient is in no acute distress.  Skin: Skin is warm and dry. No rash noted.   Cardiovascular: Normal heart rate noted  Respiratory: Normal respiratory effort, no problems with respiration noted  Abdomen: Soft, gravid, appropriate for gestational age.  Pain/Pressure: Absent     Pelvic: Cervical exam deferred        Extremities: Normal range of motion.     Mental Status: Normal mood and affect. Normal behavior. Normal judgment and thought content.   Assessment and Plan:  Pregnancy: G2P0010 at [redacted]w[redacted]d 1. Encounter for supervision of low-risk first pregnancy in second trimester BP and FHR normal Anatomy scan 7/9 Not feeling movement yet, unsure if she is feeling flutters Doing well overall  2. [redacted] weeks gestation of pregnancy Routine care, discussed AFP today - AFP, Serum, Open Spina Bifida   Preterm labor symptoms and general obstetric precautions including but not limited to vaginal bleeding, contractions, leaking of fluid and fetal movement were reviewed in detail with the  patient. Please refer to After Visit Summary for other counseling recommendations.   Return in 4 weeks for routine prenatal care    Albertine Grates, FNP

## 2022-08-12 LAB — AFP, SERUM, OPEN SPINA BIFIDA
AFP MoM: 1.5
AFP Value: 53.1 ng/mL
Gest. Age on Collection Date: 17.4 weeks
Maternal Age At EDD: 27.8 yr
OSBR Risk 1 IN: 5468
Test Results:: NEGATIVE
Weight: 220 [lb_av]

## 2022-08-16 ENCOUNTER — Encounter: Payer: Self-pay | Admitting: Advanced Practice Midwife

## 2022-08-22 ENCOUNTER — Ambulatory Visit: Payer: Medicaid Other | Attending: Obstetrics and Gynecology

## 2022-08-22 ENCOUNTER — Ambulatory Visit: Payer: Medicaid Other | Admitting: *Deleted

## 2022-08-22 ENCOUNTER — Encounter: Payer: Self-pay | Admitting: *Deleted

## 2022-08-22 VITALS — BP 113/64 | HR 85

## 2022-08-22 DIAGNOSIS — Z3480 Encounter for supervision of other normal pregnancy, unspecified trimester: Secondary | ICD-10-CM | POA: Diagnosis not present

## 2022-08-22 DIAGNOSIS — Z148 Genetic carrier of other disease: Secondary | ICD-10-CM | POA: Insufficient documentation

## 2022-08-22 DIAGNOSIS — O4442 Low lying placenta NOS or without hemorrhage, second trimester: Secondary | ICD-10-CM | POA: Insufficient documentation

## 2022-08-22 DIAGNOSIS — D563 Thalassemia minor: Secondary | ICD-10-CM

## 2022-08-22 DIAGNOSIS — O99210 Obesity complicating pregnancy, unspecified trimester: Secondary | ICD-10-CM | POA: Insufficient documentation

## 2022-08-22 DIAGNOSIS — Z3A19 19 weeks gestation of pregnancy: Secondary | ICD-10-CM | POA: Diagnosis not present

## 2022-08-22 DIAGNOSIS — O99212 Obesity complicating pregnancy, second trimester: Secondary | ICD-10-CM | POA: Insufficient documentation

## 2022-08-22 DIAGNOSIS — Z363 Encounter for antenatal screening for malformations: Secondary | ICD-10-CM | POA: Insufficient documentation

## 2022-08-22 DIAGNOSIS — O321XX0 Maternal care for breech presentation, not applicable or unspecified: Secondary | ICD-10-CM | POA: Diagnosis not present

## 2022-08-23 ENCOUNTER — Other Ambulatory Visit: Payer: Self-pay | Admitting: *Deleted

## 2022-08-23 ENCOUNTER — Encounter: Payer: Self-pay | Admitting: Family Medicine

## 2022-08-23 ENCOUNTER — Encounter: Payer: Self-pay | Admitting: Advanced Practice Midwife

## 2022-08-23 DIAGNOSIS — O4440 Low lying placenta NOS or without hemorrhage, unspecified trimester: Secondary | ICD-10-CM | POA: Insufficient documentation

## 2022-08-23 DIAGNOSIS — O99212 Obesity complicating pregnancy, second trimester: Secondary | ICD-10-CM

## 2022-09-03 ENCOUNTER — Other Ambulatory Visit: Payer: Self-pay | Admitting: Obstetrics and Gynecology

## 2022-09-03 DIAGNOSIS — Z3402 Encounter for supervision of normal first pregnancy, second trimester: Secondary | ICD-10-CM

## 2022-09-04 ENCOUNTER — Ambulatory Visit (INDEPENDENT_AMBULATORY_CARE_PROVIDER_SITE_OTHER): Payer: Medicaid Other | Admitting: Obstetrics and Gynecology

## 2022-09-04 ENCOUNTER — Encounter: Payer: Self-pay | Admitting: Obstetrics and Gynecology

## 2022-09-04 ENCOUNTER — Other Ambulatory Visit: Payer: Medicaid Other

## 2022-09-04 ENCOUNTER — Other Ambulatory Visit: Payer: Self-pay

## 2022-09-04 VITALS — BP 112/72 | HR 73 | Wt 222.7 lb

## 2022-09-04 DIAGNOSIS — O4442 Low lying placenta NOS or without hemorrhage, second trimester: Secondary | ICD-10-CM

## 2022-09-04 DIAGNOSIS — Z3402 Encounter for supervision of normal first pregnancy, second trimester: Secondary | ICD-10-CM

## 2022-09-04 DIAGNOSIS — Z3A21 21 weeks gestation of pregnancy: Secondary | ICD-10-CM

## 2022-09-04 DIAGNOSIS — O4440 Low lying placenta NOS or without hemorrhage, unspecified trimester: Secondary | ICD-10-CM

## 2022-09-04 NOTE — Progress Notes (Signed)
   PRENATAL VISIT NOTE  Subjective:  Amanda Harper is a 27 y.o. G2P0010 at [redacted]w[redacted]d being seen today for ongoing prenatal care.  She is currently monitored for the following issues for this low-risk pregnancy and has Supervision of other normal pregnancy, antepartum; Polycystic disease, ovaries; Alpha thalassemia silent carrier; Obesity affecting pregnancy; and Low-lying placenta on their problem list.  Patient reports no complaints.  Contractions: Not present. Vag. Bleeding: None.  Movement: Absent. Denies leaking of fluid.   The following portions of the patient's history were reviewed and updated as appropriate: allergies, current medications, past family history, past medical history, past social history, past surgical history and problem list.   Objective:   Vitals:   09/04/22 0913  BP: 112/72  Pulse: 73  Weight: 222 lb 11.2 oz (101 kg)    Fetal Status: Fetal Heart Rate (bpm): 137   Movement: Absent     General:  Alert, oriented and cooperative. Patient is in no acute distress.  Skin: Skin is warm and dry. No rash noted.   Cardiovascular: Normal heart rate noted  Respiratory: Normal respiratory effort, no problems with respiration noted  Abdomen: Soft, gravid, appropriate for gestational age.  Pain/Pressure: Absent     Pelvic: Cervical exam performed in the presence of a chaperone        Extremities: Normal range of motion.  Edema: None  Mental Status: Normal mood and affect. Normal behavior. Normal judgment and thought content.   Assessment and Plan:  Pregnancy: G2P0010 at [redacted]w[redacted]d 1. Encounter for supervision of low-risk first pregnancy in second trimester BP and FHR normal Feeling regular fetal movement   2. Low-lying placenta 7/9 u/s low lying placenta 1.8cm from int os, cervical length 3.1cm Follow up 8/23 Bleeding precautions provided   3. [redacted] weeks gestation of pregnancy Up to date, going on vacation in 3-4 weeks. Will return in 5 weeks for GTT. Discussed frequent  breaks/ compression socks while traveling   Preterm labor symptoms and general obstetric precautions including but not limited to vaginal bleeding, contractions, leaking of fluid and fetal movement were reviewed in detail with the patient. Please refer to After Visit Summary for other counseling recommendations.    Future Appointments  Date Time Provider Department Center  10/06/2022 12:45 PM [facility] US4 [facility]  10/11/2022  8:15 AM Constant, Peggy, MD [facility]  10/11/2022  8:50 AM [facility] LAB [facility]    Albertine Grates, FNP

## 2022-09-15 ENCOUNTER — Encounter: Payer: Self-pay | Admitting: Obstetrics and Gynecology

## 2022-09-26 ENCOUNTER — Ambulatory Visit: Payer: Medicaid Other

## 2022-10-04 ENCOUNTER — Ambulatory Visit: Payer: Medicaid Other

## 2022-10-06 ENCOUNTER — Other Ambulatory Visit: Payer: Self-pay | Admitting: *Deleted

## 2022-10-06 ENCOUNTER — Ambulatory Visit: Payer: Medicaid Other | Attending: Maternal & Fetal Medicine

## 2022-10-06 DIAGNOSIS — Z3A25 25 weeks gestation of pregnancy: Secondary | ICD-10-CM

## 2022-10-06 DIAGNOSIS — O99212 Obesity complicating pregnancy, second trimester: Secondary | ICD-10-CM

## 2022-10-06 DIAGNOSIS — E669 Obesity, unspecified: Secondary | ICD-10-CM

## 2022-10-06 DIAGNOSIS — D563 Thalassemia minor: Secondary | ICD-10-CM | POA: Diagnosis not present

## 2022-10-06 DIAGNOSIS — O99012 Anemia complicating pregnancy, second trimester: Secondary | ICD-10-CM

## 2022-10-06 DIAGNOSIS — O4442 Low lying placenta NOS or without hemorrhage, second trimester: Secondary | ICD-10-CM | POA: Diagnosis not present

## 2022-10-11 ENCOUNTER — Ambulatory Visit: Payer: Medicaid Other | Admitting: Obstetrics and Gynecology

## 2022-10-11 ENCOUNTER — Encounter: Payer: Self-pay | Admitting: Obstetrics and Gynecology

## 2022-10-11 ENCOUNTER — Other Ambulatory Visit: Payer: Medicaid Other

## 2022-10-11 ENCOUNTER — Other Ambulatory Visit: Payer: Self-pay

## 2022-10-11 VITALS — BP 112/74 | HR 97 | Wt 226.0 lb

## 2022-10-11 DIAGNOSIS — O99210 Obesity complicating pregnancy, unspecified trimester: Secondary | ICD-10-CM

## 2022-10-11 DIAGNOSIS — Z3A26 26 weeks gestation of pregnancy: Secondary | ICD-10-CM

## 2022-10-11 DIAGNOSIS — O4442 Low lying placenta NOS or without hemorrhage, second trimester: Secondary | ICD-10-CM

## 2022-10-11 DIAGNOSIS — O99212 Obesity complicating pregnancy, second trimester: Secondary | ICD-10-CM

## 2022-10-11 DIAGNOSIS — O4440 Low lying placenta NOS or without hemorrhage, unspecified trimester: Secondary | ICD-10-CM

## 2022-10-11 DIAGNOSIS — Z3480 Encounter for supervision of other normal pregnancy, unspecified trimester: Secondary | ICD-10-CM

## 2022-10-11 DIAGNOSIS — D563 Thalassemia minor: Secondary | ICD-10-CM

## 2022-10-11 DIAGNOSIS — Z23 Encounter for immunization: Secondary | ICD-10-CM

## 2022-10-11 NOTE — Patient Instructions (Addendum)
Tdap (Tetanus, Diphtheria, Pertussis) Vaccine: What You Need to Know Many vaccine information statements are available in Spanish and other languages. See PromoAge.com.br. 1. Why get vaccinated? Tdap vaccine can prevent tetanus, diphtheria, and pertussis. Diphtheria and pertussis spread from person to person. Tetanus enters the body through cuts or wounds. TETANUS (T) causes painful stiffening of the muscles. Tetanus can lead to serious health problems, including being unable to open the mouth, having trouble swallowing and breathing, or death. DIPHTHERIA (D) can lead to difficulty breathing, heart failure, paralysis, or death. PERTUSSIS (aP), also known as "whooping cough," can cause uncontrollable, violent coughing that makes it hard to breathe, eat, or drink. Pertussis can be extremely serious especially in babies and young children, causing pneumonia, convulsions, brain damage, or death. In teens and adults, it can cause weight loss, loss of bladder control, passing out, and rib fractures from severe coughing. 2. Tdap vaccine Tdap is only for children 7 years and older, adolescents, and adults.  Adolescents should receive a single dose of Tdap, preferably at age 24 or 12 years. Pregnant people should get a dose of Tdap during every pregnancy, preferably during the early part of the third trimester, to help protect the newborn from pertussis. Infants are most at risk for severe, life-threatening complications from pertussis. Adults who have never received Tdap should get a dose of Tdap. Also, adults should receive a booster dose of either Tdap or Td (a different vaccine that protects against tetanus and diphtheria but not pertussis) every 10 years, or after 5 years in the case of a severe or dirty wound or burn. Tdap may be given at the same time as other vaccines. 3. Talk with your health care provider Tell your vaccine provider if the person getting the vaccine: Has had an allergic  reaction after a previous dose of any vaccine that protects against tetanus, diphtheria, or pertussis, or has any severe, life-threatening allergies Has had a coma, decreased level of consciousness, or prolonged seizures within 7 days after a previous dose of any pertussis vaccine (DTP, DTaP, or Tdap) Has seizures or another nervous system problem Has ever had Guillain-Barr Syndrome (also called "GBS") Has had severe pain or swelling after a previous dose of any vaccine that protects against tetanus or diphtheria In some cases, your health care provider may decide to postpone Tdap vaccination until a future visit. People with minor illnesses, such as a cold, may be vaccinated. People who are moderately or severely ill should usually wait until they recover before getting Tdap vaccine.  Your health care provider can give you more information. 4. Risks of a vaccine reaction Pain, redness, or swelling where the shot was given, mild fever, headache, feeling tired, and nausea, vomiting, diarrhea, or stomachache sometimes happen after Tdap vaccination. People sometimes faint after medical procedures, including vaccination. Tell your provider if you feel dizzy or have vision changes or ringing in the ears.  As with any medicine, there is a very remote chance of a vaccine causing a severe allergic reaction, other serious injury, or death. 5. What if there is a serious problem? An allergic reaction could occur after the vaccinated person leaves the clinic. If you see signs of a severe allergic reaction (hives, swelling of the face and throat, difficulty breathing, a fast heartbeat, dizziness, or weakness), call 9-1-1 and get the person to the nearest hospital. For other signs that concern you, call your health care provider.  Adverse reactions should be reported to the Vaccine Adverse Event Reporting  System (VAERS). Your health care provider will usually file this report, or you can do it yourself. Visit the  VAERS website at www.vaers.LAgents.no or call (458)024-9800. VAERS is only for reporting reactions, and VAERS staff members do not give medical advice. 6. The National Vaccine Injury Compensation Program The [employer] Vaccine Injury Compensation Program (VICP) is a federal program that was created to compensate people who may have been injured by certain vaccines. Claims regarding alleged injury or death due to vaccination have a time limit for filing, which may be as short as two years. Visit the VICP website at SpiritualWord.at or call 614-015-4779 to learn about the program and about filing a claim. 7. How can I learn more? Ask your health care provider. Call your local or state health department. Visit the website of the Food and Drug Administration (FDA) for vaccine package inserts and additional information at FinderList.no. Contact the Centers for Disease Control and Prevention (CDC): Call 423 450 8607 (1-800-CDC-INFO) or Visit CDC's website at PicCapture.uy. Source: CDC Vaccine Information Statement Tdap (Tetanus, Diphtheria, Pertussis) Vaccine (09/19/2019) This same material is available at FootballExhibition.com.br for no charge. This information is not intended to replace advice given to you by your health care provider. Make sure you discuss any questions you have with your health care provider. Document Revised: 05/17/2022 Document Reviewed: 03/17/2022 Elsevier Patient Education  2024 Elsevier Inc.   [facility] Pediatric Providers  [place] ([address]) [facility] Medicine [facility] Manson Passey, MD; Deirdre Priest, MD; Lum Babe, MD; Leveda Anna, MD; McDiarmid, MD; Jerene Bears, MD [address] [place], [place] [address] [phone] Mon-Fri 8:30-12:30, 1:30-5:00  Providers come to see babies during newborn hospitalization Only accepting infants of Mother's who are seen at [facility] or have siblings seen at    [facility] Medicaid - Yes; Tricare - Yes   [facility], MD [address], [place], [place] [address] [phone] Mon, Tue, Thur, Fri 8:30-5:00, Wed 10:00-7:00 (closed 1-2pm daily for lunch) [facility] residents with no insurance.  [place] [employer] only with Medicaid/insurance; Tricare - no  [facility]) - [facility], MD; Manson Passey, MD; Ave Filter, MD; Luna Fuse, MD; Kennedy Bucker, MD; Florestine Avers, MD; Melchor Amour, MD; Yetta Barre,  MD; Konrad Dolores, MD; Kathlene November, MD; Jenne Campus, MD; Wynetta Emery, MD; Duffy Rhody, MD; Gerre Couch, NP [address] [place], [place], [place] [address] [phone] Mon, Tue, Thur, Fri 8:30-5:30, Wed 9:30-5:30, Sat 8:30-12:30 Only accepting infants of first-time parents or siblings of current patients Hospital discharge coordinator will make follow-up appointment Medicaid - yes; Tricare - yes  [place] [phone]) [facility] I[facility]an China, MD; Earlene Plater, MD; Jamesetta Orleans, MD; Alvera Novel, MD; Rana Snare, MD; [facility], MD; Shaaron Adler, MD; Hosie Poisson, MD; Mayford Knife, MD [address] [place], [place] [address] [phone] Mon-Fri 8:30-5:00, closed for lunch 12:30-1:30; Sat-Sun 10:00-1:00 Accepting Newborns with commercial insurance only, must call prior to delivery to be accepted into  practice.  Medicaid - no, Tricare - yes   [facility] [address] [place], [place] [address] [phone] or [phone] Mon to Fri 8am to 10pm, Sat 8am to 1pm (virtual only on weekends) Only accepts Medicaid Healthy Blue pts  [facility] ([facility]) - Pediatrics at Elige Radon, MD; Sabino Dick, MD; Quitman Livings, MD; Betha Loa, NP; Claretha Cooper, MD; Lelon Perla, MD [address] [place], [place] [address] [phone] Mon-Fri 8:30-5:30 Medicaid - yes, Tricare - yes  [place] [phone]) [facility] Marcie Mowers, MD [address] [place],  [place], [place] [address] [phone] Mon, Tues, Wed Fri 8:30-5:00, Sat 8:30-12:00, Closed Thursdays Accepting siblings of established patients and first time mom's if you call prenatally Medicaid- yes; Tricare - yes  [facility] Lutricia Feil, [place]; [place], MD; Rusty Aus; [place], [place]; Wynelle Link, MD; Azucena Cecil, MD;  [address], [place], [place] [address] [phone] Mon-Fri 8:30-5:00, closed for lunch 1-2 Only accepting newborns of established patients Medicaid- no; Tricare - yes  [facility] [phone]) [facility] Morene Crocker, MD; [address], [place], [place] [address] [phone] Mon-Fri 8:00-5:00 Medicaid - No; Tricare - Yes  [facility], [place]; [place], [place] [address], [place], [place] [address] [phone] Mon-Fri 8:00-5:00 Medicaid - No, Tricare - Yes  [facility] Cardell Peach, MD; Nash Dimmer, MD; [place], [place] [address] [place] [place], [place] [address] [phone]  Mon-Fri 8:00-5:00 Medicaid - No; Tricare - Yes  [facility] [address] [place] [address], [place], [place] [address] [phone] Mon-Fri 8:30-5:00 (lunch 12:00-1:00) Medicaid -Yes; Tricare - Yes  [facility] [place], MD [address] [place], [place], [place] [address] [phone] Mon-Fri 8:00-5:00 Seeing newborns of current patients only. No new patients Medicaid - No, Tricare - yes  Nature conservation officer at [place] [address], MD [address] [place], [place] [address] [phone] Mon-Fri 8:00-5:00 Medicaid -yes as secondary coverage only; Tricare - yes  [facility] [place], [place]; [place], [place]; Avis Epley, MD; Vonna Kotyk, MD; Clance Boll, MD; [place], [place]; Sm[place], NP; Vaughan Basta, MD; [place], MD [address] [place], [place] [address] [phone] Mon-Fri 8:30-5:00, Sat 9:00-11:00 Accepts commercial insurance ONLY. Offers free prenatal information sessions for  families. Medicaid - No, Tricare - Call first  [facility] Bluford, MD; [place], [place]; [place], [place]; [place], [place] [address] [place] [address] [phone] Mon-Fri 7:30-5:30 Medicaid - Yes; Ailene Rud yes  [place] [phone] & [phone])  [facility], MD [address] [place], [place] [address] [phone] Mon-Thur 8:00-6:00, closed for lunch 12-2, closed Fridays Medicaid - yes; Tricare - no  [facility] Dareen Piano, NP; Cyndia Bent, MD; [place], [place]; [place], [place] [address], [place], [place] [address] [phone] Mon-Fri 7:30-4:30 Medicaid - yes, Tricare - yes  [facility]  Juanito Doom, MD; Janene Harvey, NP; Vonita Moss, MD; Donn Pierini, NP [address], [place], [place] [address] [phone] Mon-Fri 8:30-5:00, closed for lunch 1-2, Sat 8:30-12:00 - sick visits only Providers come to see babies at [facility] Only accepting newborns of siblings and first time parents ONLY if who have met with office prior to delivery Medicaid -Yes; Tricare - yes  [facility] - [place], [place]; Spero Geralds, NP; Earlene Plater, MD; Lucretia Roers, MD:  [address], [place], [place] [address] [phone] Mon- Fri 8:00-5:00, Sat 9:00-12:00 - sick visits only Accepting siblings of established patients and first time mom/baby Medicaid - Yes; Tricare - yes Patients must have vaccinations (baby vaccines)  [place] [phone] & [phone])  Adult nurse HealthCare at [facility] [address] [place], [place] [address] [phone] Mon-Fri 8:00-5:00 Medicaid - no; Tricare - yes  [facility] Coronita, MD; [place], [place]; [place], [place] [address], [place], [place] [address] [phone] Mon-Fri 8:00-5:00 Medicaid- yes; Tricare - yes  [facility] - Ardeen Jourdain, MD; Yetta Barre,  NP; [place], [place] [address] [place] [address] [place], [place], [place] [address] [phone] Mon-Fri 8:00-5:00 Medicaid - Yes;  Tricare - yes  [employer] [phone])  [facility] Alfredo Bach, [place]; [place], [place]; Eddie Candle, MD; [place], MD; [address], NP; [place], DO; [place], [place]; Constance Goltz, MD; Ruthann Cancer, MD; Vear Clock, MD; [place], [place]; [place], [place]; [place], [place] [address] [facility] [address], [place], [place] [address] [phone] Mon-Fri 8:30-5:00, Sat 9:00-12:00 - sick only Please register online [email or web address] then schedule online or call office Medicaid-Yes; Tricare -yes  [facility] - [place]  [place], MD; Romualdo Bolk, MD; Corley, MD; [place], NP; [place], [place]; Antonietta Barcelona, MD; Mayford Knife, NP; Shelva Majestic, MD [address], [place], [place] [address] [phone] Mon-Fri 8:00-5:30, Sat&Sun by appointment (phones open at 8:30) Medicaid - Yes; Tricare - yes  [place] [phone] & [phone]) [facility] Pediatrics Mariel Aloe; [place], MD; Roger Shelter, MD; Arvilla Market, NP; [place], DO [address], [place], [place] [address] [phone] M-F 8:00 - 5:15, Sat/Sun 9-12 sick visits only Medicaid - No; Tricare - yes  [facility]  [place], PA-C; [place], PA-C; Crawford, DO; [place], PA-C; [place], PA-C; Roselyn Bering, MD [address], [place], [place] [address] [phone] Mon-Thur 8:00-7:00, Fri 8:00-5:00 Accepting Medicaid for 13 and under only   [facility] - Family Medicine at [facility] [place] [place], [place]; List, FNP; Berneda Rose, MD; Luther Redo, PA-C; Lavonia Drafts, MD; Kellie Simmering, FNP; Genevie Cheshire, FNP; Evaristo Bury, MD; Berneda Rose, MD [phone] [address], [place], [place] [address] [phone] Mon-Fri 8:30-5:30 Medicaid - Yes; Tricare - yes  [facility] - [address]  [place], [place]; Whitney Post, MD; Hennie Duos, MD; Wynne Dust, MD; [place], NP [address], [place], [place]  [address] [phone] Mon-Thur 8:00-5:30, Fri 8:00-5:00, Sat 9:00-12:00 Medicaid - yes, Tricare - yes  [place] [phone])  [facility] at [facility] [place], [place]; Lenise Arena, MD; [place], [place] [address], [place], [place] [address] [phone] Mon-Fri 8:00-5:00, closed for lunch 12-1 Medicaid - No; Tricare - yes  Nature conservation officer at [facility], MD [address] [place], [place] [address] [phone] Mon-Fri 8:00-5:00 Medicaid - No; Tricare - yes  [facility] - [facility], MD; Tami Ribas, MD; Mariam Dollar, MD; Yetta Barre, MD [address], [place], [place] [address] [phone] Mon-Fri 8:00-5:00 Medicaid- Yes; Tricare - yes  [place] [phone])  Adult nurse HealthCare at [facility], [place]; [place], MD [address] [place], [place], [place] [address] [phone] Mon-Fri 8:00-5:00 Medicaid - No; Tricare - yes  [facility] - Whitney Post - CPNP [address] [place], [place], [place] [address] [phone] Mon-Weds 8:00-6:00, Thurs-Fri 8:00-5:00, Sat 9:00-12:00 Medicaid - yes; Tricare - yes   [facility] Katharina Caper, MD; [place], [place] [address] [place], [place] [address] [phone] Mon-Fri 8:00-5:00 Medicaid - yes; Tricare - yes  [facility] Pediatric Providers  [facility] [address] [place], [place] [address] [phone] Sheral Flow: 8am -8pm, Tues, Weds: 8am - 5pm; Fri: 8-1 Medicaid - Yes; Tricare - yes  [facility] Rachel Bo, MD; Laural Benes, MD; Anner Crete, MD; [place], [place]; [place], [place] [address], [place], [place] [address] [phone] M-F 8:30 - 5:00 Medicaid - Call office; Tricare -yes  [facility] Edson Snowball, MD; Shanon Rosser, MD, Chelsea Primus, MD; Shirlyn Goltz, PNP; Wardell Heath, NP [phone] [address] [place], [place] [address] [phone] M-F 8:30 - 5:00, Sat/Sun 8:30 - 12:30 (sick visits) Medicaid - Call office;  Tricare -yes  [facility] Melvyn Neth, MD; Karl Luke, PNP; Princess Bruins, MD; [place], [place]; [place], NP; Cynda Familia [address] [place] [address], [place], [place] [address] [phone] M-F 8:30 - 5:00 Medicaid - Call office; Tricare - yes  [facility] Jesusita Oka, MD; Dierdre Highman, MD; Earnest Conroy, MD; Timothy Lasso, MD; [place], MD [address] [place], [place] [address] [phone] M-Thur: 8:00 - 5:00; Fri: 8:00 - 4:00 Medicaid - yes; Tricare - yes  [facility] [address] [place], [place] [address] [phone] M-F: 8:30- 5:00, closed for lunch 12:30 - 1:00 Medicaid - yes; Tricare -yes  [facility] [address] [place], [place] [address] [phone] M-F 8:00 - 5:00 Medicaid - yes; Tricare - yes  [facility] Laurium, DO; [place], DO; [place], NP [address], [place], [place] [address] [phone] M-F 8:00 - 5:00, Closed 12-1 for lunch Medicaid - Call; Tricare - yes  [facility] Pediatric[facility] Meredith Mody, MD [address], [place], [place] [address] [phone] M-F: 8:00-5:00, Sat: 8:00 - noon Medicaid - call; Tricare -yes  [facility] Pediatric Providers  [facility] [place], [place] [address] [place], [place], [place] [address] [phone] M-W: 8:00-5:00, Thur: 8:00 - 7:00, Fri: 8:00 - noon Medicaid - yes; Tricare - yes  [facility] - Quay Burow, FNP [address] [place], [place] [address] [phone] M-F 8:00 - 5:00, Closed for lunch 12-1 Medicaid - yes; Tricare - yes  [facility] Pediatric Providers  [facility] at [place], [place], Alinda Money, MD, [place], FNP-C [address] [facility], [address], [place], [place] [address] [phone] M-T 8:00-5:00, Wed-Fri 7:00-6:00 Medicaid - Yes; Tricare -yes  [facility] at [facility], DO; [address] [place], [place] [address] [phone] M-F 8:00 - 5:00, closed for  lunch 12-1 Medicaid - Yes; Tricare - yes  [facility] and Internal Medicine  Zachery Dauer, MD; Gladstone Lighter, MD; Collie Siad, MD; Freda Jackson, MD; Rich Number, MD; Darryl Nestle, MD; Melinda Crutch, MD, Audria Nine, MD; Tawanna Cooler, MD; Steffanie Dunn, MD; Byrd Hesselbach, MD; Lucretia Roers, MD [address] [place], [place] [address] [phone] M-F 8:00-5:00 Medicaid - yes; Tricare - yes  [facility] [place], MD (speaks [place] and Hindi) [address] [place], [place] [address] [phone] M-F: 8:30 - 5:00, closed 12:30 - 1 for lunch Medicaid - Yes; Tricare -yes  [facility] Pediatric Providers  [facility] Shanda Bumps, MD; Chanetta Marshall, MD; Laurell Josephs, MD [address] [place], [place] [address] [phone] M-Th: 8:00 - 5:30, Fri: 8:00 - 12:00 Medicaid - yes; Tricare - yes  [facility] Pediatric[facility] at [facility], NP; Thora Lance, MD; Orrin Brigham, MD [phone] [address] [place], [place] [address] [phone] M-F: 8:00 - 5:00 Medicaid - yes; Tricare - yes  [facility] [place], NP; Orson Slick, NP; Salley Scarlet, NP; Linton Flemings, MD; Mayford Knife, MD, [place], NP, Emelda Fear, MD; Nida Boatman [address], [place], [place] [address] [phone] M-F: 8:30 - 5:30p Medicaid - yes; Tricare - yes Other locations available as well  [facility], MD; Andrey Campanile, MD; [address], PA-C [address], [place], [place] [address] [phone] M-W: 8:00am - 7:00pm, Thurs: 8:00am - 8:00pm; Fri: 8:00am - 5:00pm, closed daily from 12-1 for lunch Medicaid - yes; Tricare - yes  [facility] Pediatric Providers  [facility] at Levin Erp, MD; Aggie Cosier, FNP; Bland Span, MD; Tristan Schroeder, MD; Laural Benes, PNP; Alesia Banda; [place], PNP; Julian Reil, MD;  [phone]  [address] [place], [place] [address] [phone] Judie Petit - Caleen Essex: 8am - 5pm, Sat 9-noon Medicaid - Yes; Tricare -yes  [facility] at Jaclynn Guarneri, MD;  Yetta Barre, FNP; Lilian Kapur, MD; Mariam Dollar, MD [address] [place], [place] [phone] M-F 8:00 - 5:00 Medicaid - call; Tricare - yes  [facility]- Cruz Condon, MD; [place], [place]; Delora Fuel, MD; Dareen Piano, MD; Trudee Grip, MD; Kizzie Ide, MD; Zebedee Iba; Birdena Crandall, MD; [place]inton Dyer, MD; [place], MD [address], [place], [place] [address] [phone] M-F 8:00am - 5:00pm; Sat. 9:00 - 11:00 Medicaid - yes; Tricare - yes  [facility] at [facility], MD [address] [place], [place] [address] [phone] M-F 8:00 - 5:00 Medicaid - [place] Medicaid only; Tricare - yes  [facility]s - Illene Bolus, MD; Earlene Plater, [place]; Kenyon Ana, MD [address], [place], [place] [address] [phone] M-F 8:00 - 5:00 Medicaid - yes; Tricare - yes  [facility] [address] Pediatrics - Lind Covert, MD; Manson Passey, MD, [facility], MD, [place], MD; [place], MD; Katrinka Blazing, MD; [address] [place], [place] [address] [phone] M-F: 8-5 Medicaid - yes; Tricare - yes  [facility] - Henrietta Hoover, [place]; [place], MD; [address] [place], [place] [address] [phone] M-F 8-5 Medicaid - yes; Tricare - yes  [address] [place] Darrol Poke, MD; Tami Ribas, MD; [place], MD; [place], DNP; [place], PNP [address], [place], [place] [address] [phone] M-F 8-5 Medicaid - yes; Tricare - yes  [facility] Pediatrics Delories Heinz, MD; Adrienne Mocha; Ala Bent, MD [address] [place], [place] [address] [phone] M-F 7am - 5 pm Medicaid - call; Tricare - yes  [facility] [place], [place]; Fredia Beets, MD; Roxan Hockey, MD [address] [place], [place] [address] [phone] M-F 8-5 Medicaid - yes; Tricare - yes  [facility] Kae Heller, MD; Sheliah Hatch, MD; Mayford Knife, FNP; Shon Baton, FNP; Tyron Russell, FNP; Ishmael Holter; [facility] - FNP [address], [place], [place] [address] [phone] M-F 8-5 Medicaid- yes; Tricare - yes  [facility] - Betsy Coder, [place] and Chalmers Guest, MD; Terrial Rhodes, MD; Hulda Humphrey, MD; Roseanne Reno, MD; [place] [place], [place]; Ala Dach, MD; Fredia Beets, MD; Dimple Casey, MD [address], [place], [place] [address] [phone] M-F: 8-5, Sat: 9-4, Sun 9-12 Medicaid - yes; Tricare - yes  [facility] [place], PNP; Earlene Plater, PNP [address] [place], [place] [address] [phone] M-F 8 - 5, closed 12-1 for lunch Medicaid - yes; Tricare - yes  [facility] Kathyrn Lass, MD; Hal Neer, MD; Dimple Casey, MD; [place], DO [address], [place], [place] [address] [phone] M-F 8- 5:30 Medicaid - yes; Tricare - yes  [facility] - Biagio Quint, MD; Rosalia Hammers, MD; Gwenith Daily, MD [address] [place], [place] [address] [phone] Judie Petit: Nicholas Lose; Tues-Fri: 8-5; Sat: 9-12 Medicaid - yes; Tricare - yes  [facility] - Bobbye Morton, MD; Daphane Shepherd, MD; Chestine Spore, MD; Haskell Riling, MD; Kate Sable, MD [address] [place], [place] [address] [phone] Judie PetitMarland Kitchen Nicholas LoseFrancee Nodal: 8-5; Sat: 8:30-12:30 Medicaid - yes; Tricare - yes  [facility]lena Heck[facility] - Beckey Rutter, MD; [place], [place] [address], [place], [place] [address] [phone] Mon-Fri: 8-5 Medicaid - yes; Tricare - yes  [facility] - [place] Becenti, CPNP; [place], [place]; Dimple Casey, MD; Alisa Graff, MD;  [place], MD; [address], [place], [place] [address] [phone] M-F: 8-5, closed 1-2 for lunch Medicaid - yes; Tricare - yes  [facility] - [facility] [place], [place]; [place], [place]; Katrinka Blazing, MD; [place], CPNP; [place], [place]; [place], MD;  Earlene Plater, MD [address], [place], [place] [address] [phone] M-Thurs: Nicholas Lose; Fri: 8-6; Sat: 9-12; Sun 2-4 Medicaid - yes; Tricare - yes  [facility] Georgeanna Lea, MD; Evette Cristal, MD; Shea Stakes, FNP; Earney Mallet, DO; [address] [place], [place] [address] [phone] M-F: 8-5 Medicaid - yes; Tricare - yes  [facility] Pediatric Providers  [facility] -Collene Mares, MD; [place], NP [address], [place], [place] [address] [phone] M - Fri: 8am - 5pm, closed for lunch 12-1 Medicaid - Yes; Tricare - yes  [facility] Elinor Parkinson, MD; Victory Dakin, MD; [place], DO; Vinocur, MD;Hall, PA; Clent [place], [place]; Orvan Falconer, NP [phone] [place], [place] [address] [phone] M-F 8:00 - 5:00, Sat 8:00 - 11:30 Medicaid - yes; Tricare - yes  [facility] Welton Flakes, MD; [place], MD, [address], MD, [place], MD, [place], MD; [place], NP; [place], [place];  [address], [place], [place] [address] [phone] M-F 8:10am - 5:00pm Medicaid - yes; Tricare - yes  [facility] Eustaquio Boyden, MD; [place], NP [address] [place], [place] [address] [phone] M-F 8:00 - 5:00 Medicaid - [place] Medicaid only; Tricare - yes  [facility] - [place] [address] [place], MD; [place], NP [address], [place], [place] [address] [phone] M-F 8:00 - 5:00; Closed for lunch 12 - 1:00 Medicaid - yes; Tricare - yes  [facility] Belva Crome, MD; Jonita Albee, FNP [address] [place], [place] [address] [phone] Mon 9-5; Tues/Wed 10-5; Thurs 8:30-5; Fri: 8-12:30 Medicaid - yes; Tricare - yes  [facility] Pediatric Providers  [facility]  [place], MD; [place], [place] [address] [place], [place] [address] [phone] phone [phone] fax M-F 7:15 - 4:30 Medicaid - yes;  Tricare - yes  [place] [facility] Karilyn Cota, MD; [place], DO [address], [place], [place] [address] [phone] M-Fri: 8:30 - 5:00, closed for lunch everyday noon - 1pm Medicaid - Yes; Tricare - yes  [facility] Burdine, MD; Reuel Boom, MD; Dimas Aguas, MD; Neita Carp, MD; [place], [place]; Bonnita Nasuti, [place]; [place], [place]; [place], [place]; [place], [place] [address] [place], [place] [address] [phone] M-Thurs: 7:30am - 7:00pm; Friday 7:30am - 4pm; Sat: 8:00 - 1:00 Medicaid - Yes; Tricare - yes  [place] [facility] Morton, MD; Conni Elliot, MD; Carroll Kinds, MD; [place], DO [address], [place], [place] [address] [phone] M-Thur: 8:00 - 5:00, Fri: 8:00 - Noon Medicaid - yes; Tricare - yes No [facility] [employer]  [place] [facility] Det[place], MD; Nadine Counts, DO; [place], NP; Daphine Deutscher, NP; Lequita Halt, NP; Ellamae Sia, NP; Reginia Forts, NP; Darlyn Read, MD; [place], [place] [address] [place], [place] [address] [phone] M-F 8:00 - 5:00 Medicaid - yes; Tricare - yes  [facility], FNP-C; [place], FNP-C [address] [place], [place] [address] [phone] M, W, R 8:00-5:00, Tues: 8:00am - 7:00pm; Fri 8:00 - noon Medicaid - Yes; Tricare - yes  [facility], MD [address] [place], [place] [address] [phone]  M-Thurs 8:30-5:30, Fri: 8:30-12:30pm Medicaid - Yes; Tricare - N

## 2022-10-11 NOTE — Progress Notes (Signed)
   PRENATAL VISIT NOTE  Subjective:  Amanda Harper is a 27 y.o. G2P0010 at [redacted]w[redacted]d being seen today for ongoing prenatal care.  She is currently monitored for the following issues for this low-risk pregnancy and has Supervision of other normal pregnancy, antepartum; Polycystic disease, ovaries; Alpha thalassemia silent carrier; Obesity affecting pregnancy; and Low-lying placenta on their problem list.  Patient reports no complaints.  Contractions: Not present. Vag. Bleeding: None.  Movement: Present. Denies leaking of fluid.   The following portions of the patient's history were reviewed and updated as appropriate: allergies, current medications, past family history, past medical history, past social history, past surgical history and problem list.   Objective:   Vitals:   10/11/22 0849  BP: 112/74  Pulse: 97  Weight: 226 lb (102.5 kg)    Fetal Status: Fetal Heart Rate (bpm): 145 Fundal Height: 28 cm Movement: Present     General:  Alert, oriented and cooperative. Patient is in no acute distress.  Skin: Skin is warm and dry. No rash noted.   Cardiovascular: Normal heart rate noted  Respiratory: Normal respiratory effort, no problems with respiration noted  Abdomen: Soft, gravid, appropriate for gestational age.  Pain/Pressure: Present     Pelvic: Cervical exam deferred        Extremities: Normal range of motion.  Edema: None  Mental Status: Normal mood and affect. Normal behavior. Normal judgment and thought content.   Assessment and Plan:  Pregnancy: G2P0010 at [redacted]w[redacted]d 1. Supervision of other normal pregnancy, antepartum Patient is doing well without complaints  2. Obesity affecting pregnancy, antepartum, unspecified obesity type Follow up growth ultrasound in October Continue ASA  3. Low-lying placenta Resolved  4. Alpha thalassemia silent carrier Declined partner testing  Preterm labor symptoms and general obstetric precautions including but not limited to vaginal  bleeding, contractions, leaking of fluid and fetal movement were reviewed in detail with the patient. Please refer to After Visit Summary for other counseling recommendations.   Return in about 2 weeks (around 10/25/2022) for in person, ROB, Low risk.  Future Appointments  Date Time Provider Department Center  11/24/2022  3:30 PM [facility]    Catalina Antigua, MD

## 2022-10-12 LAB — GLUCOSE TOLERANCE, 2 HOURS W/ 1HR
Glucose, 1 hour: 128 mg/dL (ref 70–179)
Glucose, 2 hour: 77 mg/dL (ref 70–152)
Glucose, Fasting: 79 mg/dL (ref 70–91)

## 2022-10-12 LAB — CBC
Hematocrit: 34 % (ref 34.0–46.6)
Hemoglobin: 11.4 g/dL (ref 11.1–15.9)
MCH: 27.5 pg (ref 26.6–33.0)
MCHC: 33.5 g/dL (ref 31.5–35.7)
MCV: 82 fL (ref 79–97)
Platelets: 207 10*3/uL (ref 150–450)
RBC: 4.15 x10E6/uL (ref 3.77–5.28)
RDW: 13 % (ref 11.7–15.4)
WBC: 7.1 10*3/uL (ref 3.4–10.8)

## 2022-10-12 LAB — RPR: RPR Ser Ql: NONREACTIVE

## 2022-10-12 LAB — HIV ANTIBODY (ROUTINE TESTING W REFLEX): HIV Screen 4th Generation wRfx: NONREACTIVE

## 2022-10-31 ENCOUNTER — Encounter: Payer: Self-pay | Admitting: Obstetrics and Gynecology

## 2022-10-31 ENCOUNTER — Other Ambulatory Visit: Payer: Self-pay

## 2022-10-31 ENCOUNTER — Ambulatory Visit (INDEPENDENT_AMBULATORY_CARE_PROVIDER_SITE_OTHER): Payer: Medicaid Other | Admitting: Obstetrics and Gynecology

## 2022-10-31 VITALS — BP 122/73 | HR 95 | Wt 231.0 lb

## 2022-10-31 DIAGNOSIS — Z23 Encounter for immunization: Secondary | ICD-10-CM | POA: Diagnosis not present

## 2022-10-31 DIAGNOSIS — D563 Thalassemia minor: Secondary | ICD-10-CM

## 2022-10-31 DIAGNOSIS — Z3480 Encounter for supervision of other normal pregnancy, unspecified trimester: Secondary | ICD-10-CM

## 2022-10-31 DIAGNOSIS — O99210 Obesity complicating pregnancy, unspecified trimester: Secondary | ICD-10-CM

## 2022-10-31 DIAGNOSIS — Z3A29 29 weeks gestation of pregnancy: Secondary | ICD-10-CM

## 2022-10-31 DIAGNOSIS — O99213 Obesity complicating pregnancy, third trimester: Secondary | ICD-10-CM

## 2022-10-31 NOTE — Progress Notes (Signed)
Subjective:  Amanda Harper is a 27 y.o. G2P0010 at [redacted]w[redacted]d being seen today for ongoing prenatal care.  She is currently monitored for the following issues for this low-risk pregnancy and has Supervision of other normal pregnancy, antepartum; Polycystic disease, ovaries; Alpha thalassemia silent carrier; and Obesity affecting pregnancy on their problem list.  Patient reports no complaints.  Contractions: Not present. Vag. Bleeding: None.  Movement: Present. Denies leaking of fluid.   The following portions of the patient's history were reviewed and updated as appropriate: allergies, current medications, past family history, past medical history, past social history, past surgical history and problem list. Problem list updated.  Objective:   Vitals:   10/31/22 1319  BP: 122/73  Pulse: 95  Weight: 104.8 kg    Fetal Status: Fetal Heart Rate (bpm): 152   Movement: Present     General:  Alert, oriented and cooperative. Patient is in no acute distress.  Skin: Skin is warm and dry. No rash noted.   Cardiovascular: Normal heart rate noted  Respiratory: Normal respiratory effort, no problems with respiration noted  Abdomen: Soft, gravid, appropriate for gestational age. Pain/Pressure: Present (pressure)     Pelvic:  Cervical exam deferred        Extremities: Normal range of motion.  Edema: None  Mental Status: Normal mood and affect. Normal behavior. Normal judgment and thought content.   Urinalysis:      Assessment and Plan:  Pregnancy: G2P0010 at [redacted]w[redacted]d  1. Supervision of other normal pregnancy, antepartum Stable - Flu vaccine trivalent PF, 6mos and older(Flulaval,Afluria,Fluarix,Fluzone)  2. Alpha thalassemia silent carrier Stable  3. Obesity affecting pregnancy, antepartum, unspecified obesity type Growth scan scheduled for next month  Preterm labor symptoms and general obstetric precautions including but not limited to vaginal bleeding, contractions, leaking of fluid and fetal  movement were reviewed in detail with the patient. Please refer to After Visit Summary for other counseling recommendations.  Return in about 2 weeks (around 11/14/2022) for OB visit, face to face, any provider.   Hermina Staggers, MD

## 2022-11-06 ENCOUNTER — Encounter: Payer: Self-pay | Admitting: Obstetrics and Gynecology

## 2022-11-14 ENCOUNTER — Encounter: Payer: Self-pay | Admitting: Obstetrics and Gynecology

## 2022-11-14 ENCOUNTER — Ambulatory Visit (INDEPENDENT_AMBULATORY_CARE_PROVIDER_SITE_OTHER): Payer: Medicaid Other | Admitting: Obstetrics and Gynecology

## 2022-11-14 ENCOUNTER — Other Ambulatory Visit: Payer: Self-pay

## 2022-11-14 VITALS — BP 120/80 | HR 102 | Wt 230.1 lb

## 2022-11-14 DIAGNOSIS — D563 Thalassemia minor: Secondary | ICD-10-CM

## 2022-11-14 DIAGNOSIS — O99213 Obesity complicating pregnancy, third trimester: Secondary | ICD-10-CM

## 2022-11-14 DIAGNOSIS — O99210 Obesity complicating pregnancy, unspecified trimester: Secondary | ICD-10-CM

## 2022-11-14 DIAGNOSIS — Z3480 Encounter for supervision of other normal pregnancy, unspecified trimester: Secondary | ICD-10-CM

## 2022-11-14 DIAGNOSIS — Z3A31 31 weeks gestation of pregnancy: Secondary | ICD-10-CM

## 2022-11-14 DIAGNOSIS — K219 Gastro-esophageal reflux disease without esophagitis: Secondary | ICD-10-CM

## 2022-11-14 MED ORDER — KETOCONAZOLE 2 % EX CREA
1.0000 | TOPICAL_CREAM | Freq: Every day | CUTANEOUS | 1 refills | Status: DC
Start: 2022-11-14 — End: 2023-01-15

## 2022-11-14 MED ORDER — OMEPRAZOLE 20 MG PO CPDR
40.0000 mg | DELAYED_RELEASE_CAPSULE | Freq: Every day | ORAL | 4 refills | Status: DC
Start: 2022-11-14 — End: 2022-11-28

## 2022-11-14 NOTE — Progress Notes (Signed)
Subjective:  Amanda Harper is a 27 y.o. G2P0010 at [redacted]w[redacted]d being seen today for ongoing prenatal care.  She is currently monitored for the following issues for this low-risk pregnancy and has Supervision of other normal pregnancy, antepartum; Polycystic disease, ovaries; Alpha thalassemia silent carrier; and Obesity affecting pregnancy on their problem list.  Patient reports  general discomforts of pregnancy .  Contractions: Irritability. Vag. Bleeding: None.  Movement: Present. Denies leaking of fluid.   The following portions of the patient's history were reviewed and updated as appropriate: allergies, current medications, past family history, past medical history, past social history, past surgical history and problem list. Problem list updated.  Objective:   Vitals:   11/14/22 1503  BP: 120/80  Pulse: (!) 102  Weight: 230 lb 1.6 oz (104.4 kg)    Fetal Status: Fetal Heart Rate (bpm): 134   Movement: Present     General:  Alert, oriented and cooperative. Patient is in no acute distress.  Skin: Skin is warm and dry. No rash noted.   Cardiovascular: Normal heart rate noted  Respiratory: Normal respiratory effort, no problems with respiration noted  Abdomen: Soft, gravid, appropriate for gestational age. Pain/Pressure: Present     Pelvic:  Cervical exam deferred        Extremities: Normal range of motion.  Edema: Trace  Mental Status: Normal mood and affect. Normal behavior. Normal judgment and thought content.   Urinalysis:      Assessment and Plan:  Pregnancy: G2P0010 at [redacted]w[redacted]d  1. Supervision of other normal pregnancy, antepartum Stable - omeprazole (PRILOSEC) 20 MG capsule; Take 2 capsules (40 mg total) by mouth daily.  Dispense: 30 capsule; Refill: 4 - ketoconazole (NIZORAL) 2 % cream; Apply 1 Application topically daily.  Dispense: 15 g; Refill: 1  2. Alpha thalassemia silent carrier Stable  3. Obesity affecting pregnancy, antepartum, unspecified obesity type Growth scan  next week  4. Gastroesophageal reflux disease without esophagitis  - omeprazole (PRILOSEC) 20 MG capsule; Take 2 capsules (40 mg total) by mouth daily.  Dispense: 30 capsule; Refill: 4  Preterm labor symptoms and general obstetric precautions including but not limited to vaginal bleeding, contractions, leaking of fluid and fetal movement were reviewed in detail with the patient. Please refer to After Visit Summary for other counseling recommendations.  Return in about 2 weeks (around 11/28/2022) for OB visit, face to face, any provider.   Hermina Staggers, MD

## 2022-11-17 ENCOUNTER — Ambulatory Visit
Admission: EM | Admit: 2022-11-17 | Discharge: 2022-11-17 | Disposition: A | Discharge status: Home / Self Care | Payer: Medicaid Other | Attending: Internal Medicine | Admitting: Internal Medicine

## 2022-11-17 ENCOUNTER — Ambulatory Visit: Payer: Medicaid Other

## 2022-11-17 DIAGNOSIS — M79674 Pain in right toe(s): Secondary | ICD-10-CM | POA: Diagnosis not present

## 2022-11-17 NOTE — ED Provider Notes (Signed)
[facility]    CSN: [id number] Arrival date & time: 11/17/22  1847      History   Chief Complaint Chief Complaint  Patient presents with   Foot Pain    HPI Amanda Harper is a 27 y.o. female.   Patient presents with right fifth digit toe pain after an injury that occurred 2 days ago.  Patient reports that she stubbed her toe.  She has not taken anything for pain.  Denies numbness or tingling.  Patient states that she is currently [redacted] weeks pregnant.   Foot Pain    Past Medical History:  Diagnosis Date   Acne    GERD (gastroesophageal reflux disease)    Helicobacter pylori gastritis 07/11/2011   Noise-induced hearing loss of both ears 11/15/2015   Osgood-Schlatter's disease of left knee    Patella alta 11/05/2017   PCOS (polycystic ovarian syndrome)    PCOS (polycystic ovarian syndrome)     Patient Active Problem List   Diagnosis Date Noted   Obesity affecting pregnancy 08/22/2022   Alpha thalassemia silent carrier 07/25/2022   Supervision of other normal pregnancy, antepartum 07/06/2022   Polycystic disease, ovaries 03/25/2012    Past Surgical History:  Procedure Laterality Date   NO PAST SURGERIES      OB History     Gravida  2   Para      Term      Preterm      AB  1   Living  0      SAB  1   IAB      Ectopic      Multiple      Live Births               Home Medications    Prior to Admission medications   Medication Sig Start Date End Date Taking? Authorizing Provider  acetaminophen (TYLENOL) 500 MG tablet Take 2 tablets (1,000 mg total) by mouth every 6 (six) hours as needed. 06/29/21   Carlisle Beers, FNP  aspirin EC 81 MG tablet Take 1 tablet (81 mg total) by mouth daily. Start taking when you are [redacted] weeks pregnant for rest of pregnancy for prevention of preeclampsia 07/13/22   Sue Lush, FNP  ketoconazole (NIZORAL) 2 % cream Apply 1 Application topically daily. 11/14/22   Hermina Staggers, MD   omeprazole (PRILOSEC) 20 MG capsule Take 2 capsules (40 mg total) by mouth daily. 11/14/22   Hermina Staggers, MD  Prenatal 27-1 MG TABS TAKE 1 TABLET BY MOUTH EVERY DAY 09/03/22   Sue Lush, FNP    Family History Family History  Problem Relation Age of Onset   Arthritis Mother    Arthritis Father    Heart disease Father    Hypertension Father    Hypertension Sister    Hypertension Maternal Grandmother    Hypertension Maternal Grandfather    Diabetes Maternal Grandfather    Hyperlipidemia Paternal Grandmother    Cancer Neg Hx    Alcohol abuse Neg Hx    Drug abuse Neg Hx    Early death Neg Hx    Kidney disease Neg Hx     Social History Social History   Tobacco Use   Smoking status: Never   Smokeless tobacco: Never  Vaping Use   Vaping status: Never Used  Substance Use Topics   Alcohol use: No   Drug use: No     Allergies   Metformin and related, Pork-derived  products, and Other   Review of Systems Review of Systems Per HPI  Physical Exam Triage Vital Signs ED Triage Vitals [11/17/22 1907]  Encounter Vitals Group     BP 114/78     Systolic BP Percentile      Diastolic BP Percentile      Pulse Rate 77     Resp 16     Temp 98.3 F (36.8 C)     Temp Source Oral     SpO2 97 %     Weight      Height      Head Circumference      Peak Flow      Pain Score 4     Pain Loc      Pain Education      Exclude from Growth Chart    No data found.  Updated Vital Signs BP 114/78 (BP Location: Left Arm)   Pulse 77   Temp 98.3 F (36.8 C) (Oral)   Resp 16   LMP  (Approximate) Comment: no period since miscarriage,  bled 2/7-2/29  SpO2 97%   Visual Acuity Right Eye Distance:   Left Eye Distance:   Bilateral Distance:    Right Eye Near:   Left Eye Near:    Bilateral Near:     Physical Exam Constitutional:      General: She is not in acute distress.    Appearance: Normal appearance. She is not toxic-appearing or diaphoretic.  HENT:     Head:  Normocephalic and atraumatic.  Eyes:     Extraocular Movements: Extraocular movements intact.     Conjunctiva/sclera: Conjunctivae normal.  Pulmonary:     Effort: Pulmonary effort is normal.  Feet:     Comments: Patient has tenderness to palpation throughout right fifth toe that extends slightly into the foot.  Mild swelling noted.  No discoloration, lacerations, abrasions noted.  Full range of motion of the toe present.  Appears to be neurovascularly intact. Neurological:     General: No focal deficit present.     Mental Status: She is alert and oriented to person, place, and time. Mental status is at baseline.  Psychiatric:        Mood and Affect: Mood normal.        Behavior: Behavior normal.        Thought Content: Thought content normal.        Judgment: Judgment normal.      UC Treatments / Results  Labs (all labs ordered are listed, but only abnormal results are displayed) Labs Reviewed - No data to display  EKG   Radiology No results found.  Procedures Procedures (including critical care time)  Medications Ordered in UC Medications - No data to display  Initial Impression / Assessment and Plan / UC Course  I have reviewed the triage vital signs and the nursing notes.  Pertinent labs & imaging results that were available during my care of the patient were reviewed by me and considered in my medical decision making (see chart for details).     Discussed with patient x-ray would typically be recommended but she is currently pregnant.  Discussed risks of x-ray imaging while pregnant.  Patient voiced understanding of risks but wishes to have x-ray completed today.  Therefore, discussed with x-ray tech to x-ray to ensure appropriate shielding.  X-ray was negative for any acute bony abnormality.  Buddy tape applied to help with support and stability.  Advised supportive care including ice, elevation, safe  over-the-counter pain relievers as needed.  Follow-up with foot  doctor provided phone number symptoms persist or worsen.  Patient verbalized understanding and was agreeable plan. Final Clinical Impressions(s) / UC Diagnoses   Final diagnoses:  Pain in right toe(s)     Discharge Instructions      I will call with x-ray results are abnormal.  Your toes have been taped to help with support stability.  You may apply ice and elevate extremity.  Follow-up with a foot doctor if pain persist or worsens.   ED Prescriptions   None    PDMP not reviewed this encounter.

## 2022-11-17 NOTE — Discharge Instructions (Signed)
I will call with x-ray results are abnormal.  Your toes have been taped to help with support stability.  You may apply ice and elevate extremity.  Follow-up with a foot doctor if pain persist or worsens.

## 2022-11-17 NOTE — ED Triage Notes (Signed)
Pt states she stubbed her right pinky toe 2 days ago.  States today she noticed a bruise on top of her foot.  Pt is [redacted] weeks pregnant.

## 2022-11-24 ENCOUNTER — Ambulatory Visit: Payer: Medicaid Other | Attending: Obstetrics

## 2022-11-24 DIAGNOSIS — D563 Thalassemia minor: Secondary | ICD-10-CM | POA: Diagnosis not present

## 2022-11-24 DIAGNOSIS — E669 Obesity, unspecified: Secondary | ICD-10-CM | POA: Diagnosis not present

## 2022-11-24 DIAGNOSIS — Z3A32 32 weeks gestation of pregnancy: Secondary | ICD-10-CM

## 2022-11-24 DIAGNOSIS — O99212 Obesity complicating pregnancy, second trimester: Secondary | ICD-10-CM | POA: Diagnosis present

## 2022-11-24 DIAGNOSIS — O285 Abnormal chromosomal and genetic finding on antenatal screening of mother: Secondary | ICD-10-CM | POA: Diagnosis not present

## 2022-11-24 DIAGNOSIS — O99213 Obesity complicating pregnancy, third trimester: Secondary | ICD-10-CM

## 2022-11-28 ENCOUNTER — Encounter: Payer: Self-pay | Admitting: Family Medicine

## 2022-11-28 ENCOUNTER — Ambulatory Visit (INDEPENDENT_AMBULATORY_CARE_PROVIDER_SITE_OTHER): Payer: Medicaid Other | Admitting: Family Medicine

## 2022-11-28 ENCOUNTER — Other Ambulatory Visit: Payer: Self-pay

## 2022-11-28 VITALS — BP 109/75 | HR 112 | Wt 231.0 lb

## 2022-11-28 DIAGNOSIS — Z3480 Encounter for supervision of other normal pregnancy, unspecified trimester: Secondary | ICD-10-CM

## 2022-11-28 DIAGNOSIS — O99213 Obesity complicating pregnancy, third trimester: Secondary | ICD-10-CM

## 2022-11-28 DIAGNOSIS — O99210 Obesity complicating pregnancy, unspecified trimester: Secondary | ICD-10-CM

## 2022-11-28 DIAGNOSIS — K219 Gastro-esophageal reflux disease without esophagitis: Secondary | ICD-10-CM

## 2022-11-28 DIAGNOSIS — Z3A33 33 weeks gestation of pregnancy: Secondary | ICD-10-CM

## 2022-11-28 MED ORDER — OMEPRAZOLE 40 MG PO CPDR: 40.0000 mg | DELAYED_RELEASE_CAPSULE | Freq: Every day | ORAL | 3 refills | Status: DC

## 2022-11-28 NOTE — Progress Notes (Signed)
   Subjective:  Amanda Harper is a 27 y.o. G2P0010 at 102w2d being seen today for ongoing prenatal care.  She is currently monitored for the following issues for this low-risk pregnancy and has Supervision of other normal pregnancy, antepartum; Polycystic disease, ovaries; Alpha thalassemia silent carrier; and Obesity affecting pregnancy on their problem list.  Patient reports no complaints.  Contractions: Not present. Vag. Bleeding: None.  Movement: Present. Denies leaking of fluid.   The following portions of the patient's history were reviewed and updated as appropriate: allergies, current medications, past family history, past medical history, past social history, past surgical history and problem list. Problem list updated.  Objective:   Vitals:   11/28/22 1634  BP: 109/75  Pulse: (!) 112  Weight: 231 lb (104.8 kg)    Fetal Status: Fetal Heart Rate (bpm): 132   Movement: Present     General:  Alert, oriented and cooperative. Patient is in no acute distress.  Skin: Skin is warm and dry. No rash noted.   Cardiovascular: Normal heart rate noted  Respiratory: Normal respiratory effort, no problems with respiration noted  Abdomen: Soft, gravid, appropriate for gestational age. Pain/Pressure: Present     Pelvic: Vag. Bleeding: None     Cervical exam deferred        Extremities: Normal range of motion.  Edema: None  Mental Status: Normal mood and affect. Normal behavior. Normal judgment and thought content.   Urinalysis:      Assessment and Plan:  Pregnancy: G2P0010 at [redacted]w[redacted]d  1. Supervision of other normal pregnancy, antepartum BP and FHR normal Still having trouble getting PPI filled, rx sent again to new pharmacy per request  2. Obesity affecting pregnancy, antepartum, unspecified obesity type Last growth [place] 11/24/22, EFW 25%, AC 58%  Preterm labor symptoms and general obstetric precautions including but not limited to vaginal bleeding, contractions, leaking of fluid and  fetal movement were reviewed in detail with the patient. Please refer to After Visit Summary for other counseling recommendations.  Return in 2 weeks (on 12/12/2022) for [facility], ob visit.   Venora Maples, MD

## 2022-11-28 NOTE — Patient Instructions (Signed)

## 2022-12-07 ENCOUNTER — Encounter: Payer: Self-pay | Admitting: Obstetrics and Gynecology

## 2022-12-07 ENCOUNTER — Ambulatory Visit: Payer: Medicaid Other | Admitting: *Deleted

## 2022-12-07 ENCOUNTER — Other Ambulatory Visit: Payer: Self-pay

## 2022-12-07 VITALS — BP 109/66 | HR 97

## 2022-12-07 DIAGNOSIS — O368130 Decreased fetal movements, third trimester, not applicable or unspecified: Secondary | ICD-10-CM | POA: Diagnosis not present

## 2022-12-07 DIAGNOSIS — Z3A34 34 weeks gestation of pregnancy: Secondary | ICD-10-CM | POA: Diagnosis not present

## 2022-12-07 NOTE — Progress Notes (Signed)
Pt presents for NST due to complaint earlier today that she was not feeling the baby move in the usual manner. She states only feeling spastic type movement last night.and this morning.  NST reactive today. She felt good FM during NST which was normal in character - kicking and wiggling. Pt was reassured of fetal well-being. All questions answered. Pt was advised to go to hospital if she has future concerns regarding fetal movement and is not able to contact our office or is outside of normal business hours. She voiced understanding and will keep next appt on 10/29 as scheduled.

## 2022-12-12 ENCOUNTER — Other Ambulatory Visit: Payer: Self-pay

## 2022-12-12 ENCOUNTER — Ambulatory Visit (INDEPENDENT_AMBULATORY_CARE_PROVIDER_SITE_OTHER): Payer: Medicaid Other | Admitting: Student

## 2022-12-12 VITALS — BP 117/79 | HR 88 | Wt 236.2 lb

## 2022-12-12 DIAGNOSIS — Z2911 Encounter for prophylactic immunotherapy for respiratory syncytial virus (RSV): Secondary | ICD-10-CM | POA: Diagnosis not present

## 2022-12-12 DIAGNOSIS — Z3480 Encounter for supervision of other normal pregnancy, unspecified trimester: Secondary | ICD-10-CM

## 2022-12-12 DIAGNOSIS — D563 Thalassemia minor: Secondary | ICD-10-CM

## 2022-12-12 DIAGNOSIS — Z3A35 35 weeks gestation of pregnancy: Secondary | ICD-10-CM

## 2022-12-12 NOTE — Progress Notes (Unsigned)
   PRENATAL VISIT NOTE  Subjective:  Amanda Harper is a 27 y.o. G2P0010 at [redacted]w[redacted]d being seen today for ongoing prenatal care.  She is currently monitored for the following issues for this low-risk pregnancy and has Supervision of other normal pregnancy, antepartum; Polycystic disease, ovaries; Alpha thalassemia silent carrier; and Obesity affecting pregnancy on their problem list.  Patient reports {sx:14538}.  Contractions: Not present. Vag. Bleeding: None.  Movement: Present. Denies leaking of fluid.   The following portions of the patient's history were reviewed and updated as appropriate: allergies, current medications, past family history, past medical history, past social history, past surgical history and problem list.   Objective:   Vitals:   12/12/22 1622  BP: 117/79  Pulse: 88  Weight: 236 lb 3.2 oz (107.1 kg)    Fetal Status: Fetal Heart Rate (bpm): 141   Movement: Present     General:  Alert, oriented and cooperative. Patient is in no acute distress.  Skin: Skin is warm and dry. No rash noted.   Cardiovascular: Normal heart rate noted  Respiratory: Normal respiratory effort, no problems with respiration noted  Abdomen: Soft, gravid, appropriate for gestational age.  Pain/Pressure: Present     Pelvic: Cervical exam deferred        Extremities: Normal range of motion.  Edema: None  Mental Status: Normal mood and affect. Normal behavior. Normal judgment and thought content.   Assessment and Plan:  Pregnancy: G2P0010 at [redacted]w[redacted]d 1. Supervision of other normal pregnancy, antepartum ***  2. [redacted] weeks gestation of pregnancy - swabs collected   3. Alpha thalassemia silent carrier - Previously declined partner testing  {Blank single:19197::"Term","Preterm"} labor symptoms and general obstetric precautions including but not limited to vaginal bleeding, contractions, leaking of fluid and fetal movement were reviewed in detail with the patient. Please refer to After Visit  Summary for other counseling recommendations.   No follow-ups on file.  No future appointments.   Corlis Hove, NP

## 2022-12-19 ENCOUNTER — Ambulatory Visit (INDEPENDENT_AMBULATORY_CARE_PROVIDER_SITE_OTHER): Payer: Medicaid Other | Admitting: Advanced Practice Midwife

## 2022-12-19 ENCOUNTER — Other Ambulatory Visit (HOSPITAL_COMMUNITY)
Admission: RE | Admit: 2022-12-19 | Discharge: 2022-12-19 | Disposition: A | Discharge status: Home / Self Care | Payer: Medicaid Other | Source: Ambulatory Visit | Attending: Advanced Practice Midwife | Admitting: Advanced Practice Midwife

## 2022-12-19 ENCOUNTER — Other Ambulatory Visit: Payer: Self-pay

## 2022-12-19 VITALS — BP 120/61 | HR 63 | Wt 235.7 lb

## 2022-12-19 DIAGNOSIS — O99213 Obesity complicating pregnancy, third trimester: Secondary | ICD-10-CM

## 2022-12-19 DIAGNOSIS — Z124 Encounter for screening for malignant neoplasm of cervix: Secondary | ICD-10-CM

## 2022-12-19 DIAGNOSIS — Z3A36 36 weeks gestation of pregnancy: Secondary | ICD-10-CM

## 2022-12-19 DIAGNOSIS — D563 Thalassemia minor: Secondary | ICD-10-CM | POA: Diagnosis not present

## 2022-12-19 DIAGNOSIS — Z3480 Encounter for supervision of other normal pregnancy, unspecified trimester: Secondary | ICD-10-CM

## 2022-12-19 NOTE — Patient Instructions (Addendum)
Respiratory Syncytial Virus (RSV) Vaccine VIS  Why get vaccinated? RSV vaccine can prevent lower respiratory tract disease caused by respiratory syncytial virus (RSV). RSV is a common respiratory virus that usually causes mild, cold-like symptoms.  RSV can cause illness in people of all ages but may be especially serious for infants and older adults.  Infants up to 83 months of age (especially those 6 months and younger) and children who were born prematurely, or who have chronic lung or heart disease or a weakened immune system, are at increased risk of severe RSV disease. Adults at highest risk for severe RSV disease include older adults, adults with chronic medical conditions such as heart or lung disease, weakened immune systems, or certain other underlying medical conditions, or who live in nursing homes or long-term care facilities. RSV spreads through direct contact with the virus, such as droplets from another person's cough or sneeze contacting your eyes, nose, or mouth. It can also be spread by touching a surface that has the virus on it, like a doorknob, and then touching your face before washing your hands.  Symptoms of RSV infection may include runny nose, decrease in appetite, coughing, sneezing, fever, or wheezing. In very young infants, symptoms of RSV may also include irritability (fussiness), decreased activity, or apnea (pauses in breathing for more than 10 seconds).  Most people recover in a week or two, but RSV can be serious, resulting in shortness of breath and low oxygen levels. RSV can cause bronchiolitis (inflammation of the small airways in the lung) and pneumonia (infection of the lungs). RSV can sometimes lead to worsening of other medical conditions such as asthma, chronic obstructive pulmonary disease (a chronic disease of the lungs that makes it hard to breathe), or congestive heart failure (when the heart can't pump enough blood and oxygen throughout the body).  Older  adults and infants who get very sick from RSV may need to be hospitalized. Some may even die.  RSV vaccine CDC recommends adults 27 years of age and older have the option to receive a single dose of RSV vaccine, based on discussions between the patient and their health care provider.  There are two options for protection of infants against RSV: maternal vaccine for the pregnant person and preventive antibodies given to the baby. Only one of these options is needed for most babies to be protected. CDC recommends a single dose of RSV vaccine for pregnant people from week 32 through week 36 of pregnancy for the prevention of RSV disease in infants under 58 months of age. This vaccine is recommended to be given from September through January for most of the [place].  However, in some locations (the territories, [place], [place], and parts of [place]), the timing of vaccination may vary as RSV circulating in these locations differs from the timing of the RSV season in the rest of the [place]   RSV vaccine may be given at the same time as other vaccines.  Talk with your health care provider Tell your vaccination provider if the person getting the vaccine:  Has had an allergic reaction after a previous dose of RSV vaccine, or has any severe, life-threatening allergies In some cases, your health care provider may decide to postpone RSV vaccination until a future visit.  People with minor illnesses, such as a cold, may be vaccinated. People who are moderately or severely ill should usually wait until they recover before getting RSV vaccine.  Your health care provider can give you more  information.  Risks of a vaccine reaction Pain, redness, and swelling where the shot is given, fatigue (feeling tired), fever, headache, nausea, diarrhea, and muscle or joint pain can happen after RSV vaccination. Serious neurologic conditions, including Guillain-Barr syndrome (GBS), have been reported after RSV  vaccination in clinical trials of older adults. It is unclear whether the vaccine caused these events.  Preterm birth and high blood pressure during pregnancy, including pre-eclampsia, have been reported among pregnant people who received RSV vaccine during clinical trials. It is unclear whether these events were caused by the vaccine.  People sometimes faint after medical procedures, including vaccination. Tell your provider if you feel dizzy or have vision changes or ringing in the ears.  As with any medicine, there is a very remote chance of a vaccine causing a severe allergic reaction, other serious injury, or death.  What if there is a serious problem? An allergic reaction could occur after the vaccinated person leaves the clinic. If you see signs of a severe allergic reaction (hives, swelling of the face and throat, difficulty breathing, a fast heartbeat, dizziness, or weakness), call 9-1-1 and get the person to the nearest hospital.  For other signs that concern you, call your health care provider.  Adverse reactions should be reported to the Vaccine Adverse Event Reporting System (VAERS). Your health care provider will usually file this report, or you can do it yourself. Visit the VAERS website or call (404)812-8513. VAERS is only for reporting reactions, and VAERS staff members do not give medical advice.  How can I learn more? Ask your health care provider. Call your local or state health department. Visit the website of the Food and Drug Administration (FDA) for vaccine package inserts and additional information. Contact the Centers for Disease Control and Prevention (CDC): Call 9850116283 (1-800-CDC-INFO) or Visit CDC's vaccine website

## 2022-12-19 NOTE — Progress Notes (Signed)
   PRENATAL VISIT NOTE  Subjective:  Amanda Harper is a 27 y.o. G2P0010 at [redacted]w[redacted]d being seen today for ongoing prenatal care.  She is currently monitored for the following issues for this low-risk pregnancy and has Supervision of other normal pregnancy, antepartum; Polycystic disease, ovaries; Alpha thalassemia silent carrier; and Obesity affecting pregnancy on their problem list.  Patient reports occasional contractions.  Contractions: Not present. Vag. Bleeding: None.  Movement: Present. Denies leaking of fluid.   The following portions of the patient's history were reviewed and updated as appropriate: allergies, current medications, past family history, past medical history, past social history, past surgical history and problem list.   Objective:   Vitals:   12/19/22 1318  BP: 120/61  Pulse: 63  Weight: 235 lb 11.2 oz (106.9 kg)    Fetal Status: Fetal Heart Rate (bpm): 150 Fundal Height: 36 cm Movement: Present  Presentation: Vertex  General:  Alert, oriented and cooperative. Patient is in no acute distress.  Skin: Skin is warm and dry. No rash noted.   Cardiovascular: Normal heart rate noted  Respiratory: Normal respiratory effort, no problems with respiration noted  Abdomen: Soft, gravid, appropriate for gestational age.  Pain/Pressure: Absent     Pelvic: Cervical exam performed in the presence of a chaperone Dilation: Closed Effacement (%): 0 Station: -3  Extremities: Normal range of motion.  Edema: None  Mental Status: Normal mood and affect. Normal behavior. Normal judgment and thought content.   Assessment and Plan:  Pregnancy: G2P0010 at [redacted]w[redacted]d 1. Supervision of other normal pregnancy, antepartum - F/U 1 week   2. Alpha thalassemia silent carrier -   3. Obesity affecting pregnancy in third trimester, unspecified obesity type - Antenatal testing per MFM  4. [redacted] weeks gestation of pregnancy - GBS - Cultures - Pap  Term labor symptoms and general obstetric  precautions including but not limited to vaginal bleeding, contractions, leaking of fluid and fetal movement were reviewed in detail with the patient. Please refer to After Visit Summary for other counseling recommendations.   Return in about 1 week (around 12/26/2022) for ROB.  No future appointments.  Dorathy Kinsman, CNM

## 2022-12-20 LAB — GC/CHLAMYDIA PROBE AMP (~~LOC~~) NOT AT ARMC
Chlamydia: NEGATIVE
Comment: NEGATIVE
Comment: NORMAL
Neisseria Gonorrhea: NEGATIVE

## 2022-12-22 LAB — CYTOLOGY - PAP: Diagnosis: NEGATIVE

## 2022-12-22 LAB — CULTURE, BETA STREP (GROUP B ONLY): Strep Gp B Culture: POSITIVE — AB

## 2022-12-24 ENCOUNTER — Encounter: Payer: Self-pay | Admitting: Advanced Practice Midwife

## 2022-12-24 DIAGNOSIS — O99820 Streptococcus B carrier state complicating pregnancy: Secondary | ICD-10-CM | POA: Insufficient documentation

## 2022-12-25 ENCOUNTER — Inpatient Hospital Stay (HOSPITAL_COMMUNITY)
Admission: AD | Admit: 2022-12-25 | Discharge: 2022-12-25 | Disposition: A | Discharge status: Home / Self Care | Payer: Medicaid Other | Attending: Obstetrics and Gynecology | Admitting: Obstetrics and Gynecology

## 2022-12-25 ENCOUNTER — Encounter (HOSPITAL_COMMUNITY): Payer: Self-pay | Admitting: Obstetrics and Gynecology

## 2022-12-25 DIAGNOSIS — D563 Thalassemia minor: Secondary | ICD-10-CM

## 2022-12-25 DIAGNOSIS — O368130 Decreased fetal movements, third trimester, not applicable or unspecified: Secondary | ICD-10-CM | POA: Insufficient documentation

## 2022-12-25 DIAGNOSIS — Z3A37 37 weeks gestation of pregnancy: Secondary | ICD-10-CM | POA: Insufficient documentation

## 2022-12-25 DIAGNOSIS — Z3480 Encounter for supervision of other normal pregnancy, unspecified trimester: Secondary | ICD-10-CM

## 2022-12-25 NOTE — MAU Note (Signed)
..  Amanda Harper is a 27 y.o. at [redacted]w[redacted]d here in MAU reporting: DFM since this morning, states she felt a very small movement when she was in the lobby but nothing throughout the day. Denies VB or LOF. +FM.   Pain score: 0 Vitals:   12/25/22 1546 12/25/22 1547  BP: 110/69   Pulse: (!) 113   Temp:  98.3 F (36.8 C)  SpO2:  98%     FHT:148 Lab orders placed from triage:   none

## 2022-12-25 NOTE — MAU Provider Note (Addendum)
12/25/22 First Provider Initiated Contact with Patient 12/25/22 1601     S [name] is a 27 y.o. G30P0010 pregnant female at [redacted]w[redacted]d who presents to MAU today with complaint of decreased fetal movement. She states that this morning she did not feel any fetal movement until she ate breakfast. She states that her baby was very active yesterday and last night but not as active today. She has eaten breakfast and lunch. She also has been drinking her 36 oz water bottle. Denies contractions, vaginal bleeding/spotting, LOF, abnormal vaginal discharge, chills, fever, headache, or swelling.    Receives care at [facility]. Prenatal records reviewed.  Pertinent items noted in HPI and remainder of comprehensive ROS otherwise negative.   O BP (!) 95/55   Pulse (!) 109   Temp 98.3 F (36.8 C) (Oral)   LMP  (Approximate) Comment: no period since miscarriage,  bled 2/7-2/29  SpO2 97%  Physical Exam Constitutional:      General: She is not in acute distress.    Appearance: Normal appearance. She is not ill-appearing, toxic-appearing or diaphoretic.  HENT:     Nose: No congestion or rhinorrhea.     Mouth/Throat:     Mouth: Mucous membranes are moist.  Eyes:     Pupils: Pupils are equal, round, and reactive to light.  Abdominal:     General: There is no distension.     Palpations: Abdomen is soft.     Tenderness: There is no abdominal tenderness. There is no guarding.  Musculoskeletal:        General: No swelling.  Skin:    Coloration: Skin is not jaundiced.     Findings: No bruising or rash.  Neurological:     Mental Status: She is alert and oriented to person, place, and time. Mental status is at baseline.  Psychiatric:        Mood and Affect: Mood normal.        Behavior: Behavior normal.        Thought Content: Thought content normal.        Judgment: Judgment normal.   Abd: gravid, soft and non-tender, no rebound no guarding  FWB: 145bpm, moderate  variability, + 15 x15 accels x2, no decels- Reactive FHT tractin Toco: no contractions SVE: deferred  MDM: -exam/HPI as above -Reactive NST -pt started to noted fetal movement during MAU visit   A Decreased Fetal Movement in Third Trimester Medical screening exam complete  P Reactive NST Fetal movement now appreciated Discharge from MAU in stable condition with labor precautions and fetal movement precautions. Follow up at [facility] tomorrow as scheduled for ongoing prenatal care.  Allergies as of 12/25/2022       Reactions   Metformin And Related    dizzy   Pork-derived Products    religious   Other Rash   Henna dye        Medication List     TAKE these medications    acetaminophen 500 MG tablet Commonly known as: TYLENOL Take 2 tablets (1,000 mg total) by mouth every 6 (six) hours as needed.   aspirin EC 81 MG tablet Take 1 tablet (81 mg total) by mouth daily. Start taking when you are [redacted] weeks pregnant for rest of pregnancy for prevention of preeclampsia   ketoconazole 2 % cream Commonly known as: NIZORAL Apply 1 Application topically daily.   omeprazole 40 MG capsule Commonly known as: PRILOSEC Take 1 capsule (40  mg total) by mouth daily.   Prenatal 27-1 MG Tabs TAKE 1 TABLET BY MOUTH EVERY DAY        Fedra Lanter, Student-PA 12/25/2022 4:07 PM

## 2022-12-26 ENCOUNTER — Ambulatory Visit (INDEPENDENT_AMBULATORY_CARE_PROVIDER_SITE_OTHER): Payer: Medicaid Other | Admitting: Family Medicine

## 2022-12-26 ENCOUNTER — Other Ambulatory Visit: Payer: Self-pay

## 2022-12-26 VITALS — BP 113/71 | HR 90 | Wt 233.0 lb

## 2022-12-26 DIAGNOSIS — Z3480 Encounter for supervision of other normal pregnancy, unspecified trimester: Secondary | ICD-10-CM

## 2022-12-26 DIAGNOSIS — O26893 Other specified pregnancy related conditions, third trimester: Secondary | ICD-10-CM

## 2022-12-26 DIAGNOSIS — R000 Tachycardia, unspecified: Secondary | ICD-10-CM | POA: Diagnosis not present

## 2022-12-26 DIAGNOSIS — Z3A37 37 weeks gestation of pregnancy: Secondary | ICD-10-CM

## 2022-12-26 DIAGNOSIS — O99213 Obesity complicating pregnancy, third trimester: Secondary | ICD-10-CM

## 2022-12-26 DIAGNOSIS — O99820 Streptococcus B carrier state complicating pregnancy: Secondary | ICD-10-CM

## 2022-12-26 DIAGNOSIS — O99013 Anemia complicating pregnancy, third trimester: Secondary | ICD-10-CM | POA: Insufficient documentation

## 2022-12-26 NOTE — Patient Instructions (Signed)

## 2022-12-26 NOTE — Progress Notes (Signed)
   Subjective:  Amanda Harper is a 27 y.o. G2P0010 at [redacted]w[redacted]d being seen today for ongoing prenatal care.  She is currently monitored for the following issues for this low-risk pregnancy and has Supervision of other normal pregnancy, antepartum; Polycystic disease, ovaries; Alpha thalassemia silent carrier; Obesity affecting pregnancy; Group B Streptococcus carrier, antepartum; and Anemia complicating pregnancy, third trimester on their problem list.  Patient reports no complaints.  Contractions: Not present. Vag. Bleeding: None.  Movement: Present. Denies leaking of fluid.   The following portions of the patient's history were reviewed and updated as appropriate: allergies, current medications, past family history, past medical history, past social history, past surgical history and problem list. Problem list updated.  Objective:   Vitals:   12/26/22 1433  BP: 113/71  Pulse: 90  Weight: 233 lb (105.7 kg)    Fetal Status: Fetal Heart Rate (bpm): 165   Movement: Present     General:  Alert, oriented and cooperative. Patient is in no acute distress.  Skin: Skin is warm and dry. No rash noted.   Cardiovascular: Normal heart rate noted  Respiratory: Normal respiratory effort, no problems with respiration noted  Abdomen: Soft, gravid, appropriate for gestational age. Pain/Pressure: Present     Pelvic: Vag. Bleeding: None     Cervical exam deferred        Extremities: Normal range of motion.  Edema: None  Mental Status: Normal mood and affect. Normal behavior. Normal judgment and thought content.   Urinalysis:      Assessment and Plan:  Pregnancy: G2P0010 at [redacted]w[redacted]d  1. Supervision of other normal pregnancy, antepartum BP and FHR normal Discuss PD and schedule PD IOL at next visit Mild fetal tachycardia, placed on NST which showed normal baseline with numerous accels  2. Group B Streptococcus carrier, antepartum Ppx in labor  3. Obesity affecting pregnancy in third trimester,  unspecified obesity type   4. Anemia complicating pregnancy, third trimester Lab Results  Component Value Date   HGB 11.4 10/11/2022     Term labor symptoms and general obstetric precautions including but not limited to vaginal bleeding, contractions, leaking of fluid and fetal movement were reviewed in detail with the patient. Please refer to After Visit Summary for other counseling recommendations.  Return in 1 week (on 01/02/2023) for [facility], ob visit.   Venora Maples, MD

## 2023-01-02 ENCOUNTER — Other Ambulatory Visit: Payer: Self-pay

## 2023-01-02 ENCOUNTER — Ambulatory Visit (INDEPENDENT_AMBULATORY_CARE_PROVIDER_SITE_OTHER): Payer: Medicaid Other | Admitting: Family Medicine

## 2023-01-02 VITALS — BP 113/68 | HR 96 | Wt 233.0 lb

## 2023-01-02 DIAGNOSIS — O99820 Streptococcus B carrier state complicating pregnancy: Secondary | ICD-10-CM

## 2023-01-02 DIAGNOSIS — O99213 Obesity complicating pregnancy, third trimester: Secondary | ICD-10-CM

## 2023-01-02 DIAGNOSIS — Z3A38 38 weeks gestation of pregnancy: Secondary | ICD-10-CM

## 2023-01-02 DIAGNOSIS — Z3480 Encounter for supervision of other normal pregnancy, unspecified trimester: Secondary | ICD-10-CM

## 2023-01-02 NOTE — Progress Notes (Signed)
   Subjective:  Amanda Harper is a 27 y.o. G2P0010 at [redacted]w[redacted]d being seen today for ongoing prenatal care.  She is currently monitored for the following issues for this high-risk pregnancy and has Supervision of other normal pregnancy, antepartum; Polycystic disease, ovaries; Alpha thalassemia silent carrier; Obesity affecting pregnancy; and Group B Streptococcus carrier, antepartum on their problem list.  Patient reports no complaints.  Contractions: Not present. Vag. Bleeding: None.  Movement: Present. Denies leaking of fluid.   The following portions of the patient's history were reviewed and updated as appropriate: allergies, current medications, past family history, past medical history, past social history, past surgical history and problem list. Problem list updated.  Objective:   Vitals:   01/02/23 1319  BP: 113/68  Pulse: 96  Weight: 233 lb (105.7 kg)    Fetal Status: Fetal Heart Rate (bpm): 150   Movement: Present     General:  Alert, oriented and cooperative. Patient is in no acute distress.  Skin: Skin is warm and dry. No rash noted.   Cardiovascular: Normal heart rate noted  Respiratory: Normal respiratory effort, no problems with respiration noted  Abdomen: Soft, gravid, appropriate for gestational age. Pain/Pressure: Present     Pelvic: Vag. Bleeding: None     Cervical exam deferred        Extremities: Normal range of motion.  Edema: None  Mental Status: Normal mood and affect. Normal behavior. Normal judgment and thought content.   Urinalysis:      Assessment and Plan:  Pregnancy: G2P0010 at [redacted]w[redacted]d  1. Supervision of other normal pregnancy, antepartum BP and FHR normal Scheduled for PD IOL Form faxed, orders placed  2. Group B Streptococcus carrier, antepartum Ppx in labor  3. Obesity affecting pregnancy in third trimester, unspecified obesity type   Term labor symptoms and general obstetric precautions including but not limited to vaginal bleeding,  contractions, leaking of fluid and fetal movement were reviewed in detail with the patient. Please refer to After Visit Summary for other counseling recommendations.  Return in 1 week (on 01/09/2023) for [facility], ob visit.   Venora Maples, MD

## 2023-01-02 NOTE — Patient Instructions (Signed)

## 2023-01-03 ENCOUNTER — Encounter: Payer: Self-pay | Admitting: General Practice

## 2023-01-09 ENCOUNTER — Ambulatory Visit: Payer: Medicaid Other | Admitting: Obstetrics and Gynecology

## 2023-01-09 VITALS — BP 109/77 | HR 114 | Wt 234.9 lb

## 2023-01-09 DIAGNOSIS — Z3A39 39 weeks gestation of pregnancy: Secondary | ICD-10-CM

## 2023-01-09 DIAGNOSIS — O99820 Streptococcus B carrier state complicating pregnancy: Secondary | ICD-10-CM

## 2023-01-09 NOTE — Progress Notes (Signed)
   PRENATAL VISIT NOTE  Subjective:  Amanda Harper is a 27 y.o. G2P0010 at [redacted]w[redacted]d being seen today for ongoing prenatal care.  She is currently monitored for the following issues for this low-risk pregnancy and has Supervision of other normal pregnancy, antepartum; Polycystic disease, ovaries; Alpha thalassemia silent carrier; Obesity affecting pregnancy; and Group B Streptococcus carrier, antepartum on their problem list.  Patient reports no complaints.  Contractions: Not present. Vag. Bleeding: None.  Movement: Present. Denies leaking of fluid.   The following portions of the patient's history were reviewed and updated as appropriate: allergies, current medications, past family history, past medical history, past social history, past surgical history and problem list.   Objective:   Vitals:   01/09/23 1317  BP: 109/77  Pulse: (!) 114  Weight: 234 lb 14.4 oz (106.5 kg)    Fetal Status: Fetal Heart Rate (bpm): 130 Fundal Height: 38 cm Movement: Present  Presentation: Vertex  General:  Alert, oriented and cooperative. Patient is in no acute distress.  Skin: Skin is warm and dry. No rash noted.   Cardiovascular: Normal heart rate noted  Respiratory: Normal respiratory effort, no problems with respiration noted  Abdomen: Soft, gravid, appropriate for gestational age.  Pain/Pressure: Present     Pelvic: Cervical exam deferred        Extremities: Normal range of motion.  Edema: None  Mental Status: Normal mood and affect. Normal behavior. Normal judgment and thought content.   Assessment and Plan:  Pregnancy: G2P0010 at [redacted]w[redacted]d 1. [redacted] weeks gestation of pregnancy Routine care. Already set up for post dates IOL Patient  amenable to post dates testing next visit.   2. Group B Streptococcus carrier, antepartum Tx in labor  Term labor symptoms and general obstetric precautions including but not limited to vaginal bleeding, contractions, leaking of fluid and fetal movement were reviewed in  detail with the patient. Please refer to After Visit Summary for other counseling recommendations.   Return in about 1 week (around 01/16/2023) for low risk ob, in office nst/bpp, md or app.  Future Appointments  Date Time Provider Department Center  01/21/2023  6:45 AM [facility] SCHED ROOM [facility] None    [place] Bing, MD

## 2023-01-13 ENCOUNTER — Inpatient Hospital Stay (HOSPITAL_COMMUNITY)
Admission: AD | Admit: 2023-01-13 | Discharge: 2023-01-13 | Disposition: A | Discharge status: Home / Self Care | Payer: Medicaid Other | Attending: Obstetrics and Gynecology | Admitting: Obstetrics and Gynecology

## 2023-01-13 ENCOUNTER — Encounter (HOSPITAL_COMMUNITY): Payer: Self-pay | Admitting: Obstetrics and Gynecology

## 2023-01-13 DIAGNOSIS — O368930 Maternal care for other specified fetal problems, third trimester, not applicable or unspecified: Secondary | ICD-10-CM | POA: Insufficient documentation

## 2023-01-13 DIAGNOSIS — Z3493 Encounter for supervision of normal pregnancy, unspecified, third trimester: Secondary | ICD-10-CM

## 2023-01-13 DIAGNOSIS — O471 False labor at or after 37 completed weeks of gestation: Secondary | ICD-10-CM

## 2023-01-13 DIAGNOSIS — Z3A39 39 weeks gestation of pregnancy: Secondary | ICD-10-CM | POA: Insufficient documentation

## 2023-01-13 NOTE — MAU Note (Signed)
..  Amanda Harper is a 27 y.o. at [redacted]w[redacted]d here in MAU reporting: increased fetal movement this evening. Denies vaginal bleeding, leaking of fluid, or pain Pain score: 0/10 Vitals:   01/13/23 0120  BP: 112/61  Pulse: 96  Resp: 19  Temp: 98.5 F (36.9 C)  SpO2: 100%     AOZ:[id number] in room 129

## 2023-01-13 NOTE — MAU Provider Note (Signed)
Chief Complaint:  No chief complaint on file.  HPI  HPI: SHAMONICA GOULDING is a 27 y.o. G2P0010 at 37w6dwho presents to maternity admissions reporting increased fetal movements.  She reports good fetal movement, denies LOF, vaginal bleeding, vaginal itching/burning, urinary symptoms, h/a, dizziness, n/v, diarrhea, constipation or fever/chills.  She denies headache, visual changes or RUQ abdominal pain.   Past Medical History: Past Medical History:  Diagnosis Date   Acne    GERD (gastroesophageal reflux disease)    Helicobacter pylori gastritis 07/11/2011   Noise-induced hearing loss of both ears 11/15/2015   Osgood-Schlatter's disease of left knee    Patella alta 11/05/2017   PCOS (polycystic ovarian syndrome)    PCOS (polycystic ovarian syndrome)     Past obstetric history: OB History  Gravida Para Term Preterm AB Living  2       1 0  SAB IAB Ectopic Multiple Live Births  1            # Outcome Date GA Lbr Len/2nd Weight Sex Type Anes PTL Lv  2 Current           1 SAB 03/2022     SAB       Past Surgical History: Past Surgical History:  Procedure Laterality Date   NO PAST SURGERIES      Family History: Family History  Problem Relation Age of Onset   Arthritis Mother    Arthritis Father    Heart disease Father    Hypertension Father    Hypertension Sister    Hypertension Maternal Grandmother    Hypertension Maternal Grandfather    Diabetes Maternal Grandfather    Hyperlipidemia Paternal Grandmother    Cancer Neg Hx    Alcohol abuse Neg Hx    Drug abuse Neg Hx    Early death Neg Hx    Kidney disease Neg Hx     Social History: Social History   Tobacco Use   Smoking status: Never   Smokeless tobacco: Never  Vaping Use   Vaping status: Never Used  Substance Use Topics   Alcohol use: No   Drug use: No    Allergies:  Allergies  Allergen Reactions   Metformin And Related     dizzy   Pork-Derived Products     religious   Other Rash    Henna dye     Meds:  Medications Prior to Admission  Medication Sig Dispense Refill Last Dose   acetaminophen (TYLENOL) 500 MG tablet Take 2 tablets (1,000 mg total) by mouth every 6 (six) hours as needed. 30 tablet 0    aspirin EC 81 MG tablet Take 1 tablet (81 mg total) by mouth daily. Start taking when you are [redacted] weeks pregnant for rest of pregnancy for prevention of preeclampsia 300 tablet 2    ketoconazole (NIZORAL) 2 % cream Apply 1 Application topically daily. (Patient not taking: Reported on 12/12/2022) 15 g 1    omeprazole (PRILOSEC) 40 MG capsule Take 1 capsule (40 mg total) by mouth daily. 90 capsule 3    Prenatal 27-1 MG TABS TAKE 1 TABLET BY MOUTH EVERY DAY 90 tablet 1     I have reviewed patient's Past Medical Hx, Surgical Hx, Family Hx, Social Hx, medications and allergies.   ROS:  Review of Systems Other systems negative  Physical Exam  Patient Vitals for the past 24 hrs:  BP Temp Temp src Pulse Resp SpO2 Height Weight  01/13/23 0120 112/61 98.5 F (36.9 C) Oral  96 19 100 % 5\' 5"  (1.651 m) 106.1 kg   Constitutional: Well-developed, well-nourished female in no acute distress.  Cardiovascular: normal rate and rhythm Respiratory: normal effort, clear to auscultation bilaterally GI: Abd soft, non-tender, gravid appropriate for gestational age.   No rebound or guarding. MS: Extremities nontender, no edema, normal ROM Neurologic: Alert and oriented x 4.  GU: Neg CVAT.   FHT:  Baseline 129 , moderate variability, accelerations present, no decelerations Contractions: Rare   Labs: No results found for this or any previous visit (from the past 24 hour(s)). O/Positive/-- (05/30 1132)  Imaging:  No results found.  MAU Course/MDM: I have reviewed the triage vital signs and the nursing notes.   Pertinent labs & imaging results that were available during my care of the patient were reviewed by me and considered in my medical decision making (see chart for details).      I have  reviewed her medical records including past results, notes and treatments.   I have ordered labs and reviewed results.  NST reviewed  Treatments in MAU included NST.    Assessment: 1. [redacted] weeks gestation of pregnancy   2. Fetal movement present during pregnancy in third trimester   Rare unperceived contractions. Very active fetus. Reactive NST.  Pt reassured by NST.   Plan: Discharge home Labor precautions and fetal kick counts Follow up in Office for prenatal visits and recheck  Pt stable at time of discharge.  Wyn Forster, MD FMOB Fellow, Faculty practice [facility], Center for [facility]  01/13/2023 1:54 AM

## 2023-01-15 ENCOUNTER — Other Ambulatory Visit: Payer: Medicaid Other

## 2023-01-15 ENCOUNTER — Other Ambulatory Visit: Payer: Self-pay

## 2023-01-15 ENCOUNTER — Ambulatory Visit (INDEPENDENT_AMBULATORY_CARE_PROVIDER_SITE_OTHER): Payer: Medicaid Other | Admitting: Advanced Practice Midwife

## 2023-01-15 ENCOUNTER — Inpatient Hospital Stay (HOSPITAL_COMMUNITY)
Admission: AD | Admit: 2023-01-15 | Discharge: 2023-01-18 | DRG: 807 | Disposition: A | Discharge status: Home / Self Care | Payer: Medicaid Other | Attending: Obstetrics & Gynecology | Admitting: Obstetrics & Gynecology

## 2023-01-15 ENCOUNTER — Encounter (HOSPITAL_COMMUNITY): Payer: Self-pay | Admitting: Family Medicine

## 2023-01-15 ENCOUNTER — Telehealth (HOSPITAL_COMMUNITY): Payer: Self-pay | Admitting: *Deleted

## 2023-01-15 ENCOUNTER — Encounter (HOSPITAL_COMMUNITY): Payer: Self-pay

## 2023-01-15 VITALS — BP 118/66 | HR 74

## 2023-01-15 DIAGNOSIS — Z8261 Family history of arthritis: Secondary | ICD-10-CM | POA: Diagnosis not present

## 2023-01-15 DIAGNOSIS — Z148 Genetic carrier of other disease: Secondary | ICD-10-CM | POA: Diagnosis not present

## 2023-01-15 DIAGNOSIS — O99210 Obesity complicating pregnancy, unspecified trimester: Secondary | ICD-10-CM | POA: Diagnosis present

## 2023-01-15 DIAGNOSIS — O99214 Obesity complicating childbirth: Secondary | ICD-10-CM | POA: Diagnosis present

## 2023-01-15 DIAGNOSIS — Z3480 Encounter for supervision of other normal pregnancy, unspecified trimester: Secondary | ICD-10-CM

## 2023-01-15 DIAGNOSIS — Z3A40 40 weeks gestation of pregnancy: Secondary | ICD-10-CM

## 2023-01-15 DIAGNOSIS — D563 Thalassemia minor: Principal | ICD-10-CM | POA: Diagnosis present

## 2023-01-15 DIAGNOSIS — Z833 Family history of diabetes mellitus: Secondary | ICD-10-CM

## 2023-01-15 DIAGNOSIS — O26893 Other specified pregnancy related conditions, third trimester: Secondary | ICD-10-CM | POA: Diagnosis present

## 2023-01-15 DIAGNOSIS — Z8249 Family history of ischemic heart disease and other diseases of the circulatory system: Secondary | ICD-10-CM

## 2023-01-15 DIAGNOSIS — Z83438 Family history of other disorder of lipoprotein metabolism and other lipidemia: Secondary | ICD-10-CM

## 2023-01-15 DIAGNOSIS — O99820 Streptococcus B carrier state complicating pregnancy: Secondary | ICD-10-CM

## 2023-01-15 DIAGNOSIS — Z888 Allergy status to other drugs, medicaments and biological substances status: Secondary | ICD-10-CM

## 2023-01-15 DIAGNOSIS — O480 Post-term pregnancy: Secondary | ICD-10-CM | POA: Diagnosis not present

## 2023-01-15 DIAGNOSIS — O99824 Streptococcus B carrier state complicating childbirth: Secondary | ICD-10-CM | POA: Diagnosis present

## 2023-01-15 LAB — CBC
HCT: 38.7 % (ref 36.0–46.0)
Hemoglobin: 12.4 g/dL (ref 12.0–15.0)
MCH: 25.6 pg — ABNORMAL LOW (ref 26.0–34.0)
MCHC: 32 g/dL (ref 30.0–36.0)
MCV: 79.8 fL — ABNORMAL LOW (ref 80.0–100.0)
Platelets: 169 10*3/uL (ref 150–400)
RBC: 4.85 MIL/uL (ref 3.87–5.11)
RDW: 14.2 % (ref 11.5–15.5)
WBC: 6.8 10*3/uL (ref 4.0–10.5)
nRBC: 0 % (ref 0.0–0.2)

## 2023-01-15 LAB — TYPE AND SCREEN
ABO/RH(D): O POS
Antibody Screen: NEGATIVE

## 2023-01-15 MED ORDER — EPHEDRINE 5 MG/ML INJ: 10.0000 mg | INTRAVENOUS | Status: DC | PRN

## 2023-01-15 MED ORDER — FENTANYL-BUPIVACAINE-NACL 0.5-0.125-0.9 MG/250ML-% EP SOLN
12.0000 mL/h | EPIDURAL | Status: DC | PRN
  Administered 2023-01-16: 12 mL/h via EPIDURAL
  Filled 2023-01-15: qty 250

## 2023-01-15 MED ORDER — PHENYLEPHRINE 80 MCG/ML (10ML) SYRINGE FOR IV PUSH (FOR BLOOD PRESSURE SUPPORT): 80.0000 ug | PREFILLED_SYRINGE | INTRAVENOUS | Status: DC | PRN

## 2023-01-15 MED ORDER — PENICILLIN G POT IN DEXTROSE 60000 UNIT/ML IV SOLN
3.0000 10*6.[IU] | INTRAVENOUS | Status: DC
  Administered 2023-01-15 – 2023-01-16 (×2): 3 10*6.[IU] via INTRAVENOUS
  Filled 2023-01-15 (×2): qty 50

## 2023-01-15 MED ORDER — DIPHENHYDRAMINE HCL 50 MG/ML IJ SOLN: 12.5000 mg | INTRAMUSCULAR | Status: DC | PRN

## 2023-01-15 MED ORDER — SOD CITRATE-CITRIC ACID 500-334 MG/5ML PO SOLN
30.0000 mL | ORAL | Status: DC | PRN
Start: 2023-01-15 — End: 2023-01-16
  Administered 2023-01-16: 30 mL via ORAL
  Filled 2023-01-15: qty 30

## 2023-01-15 MED ORDER — FENTANYL CITRATE (PF) 100 MCG/2ML IJ SOLN
50.0000 ug | INTRAMUSCULAR | Status: DC | PRN
Start: 2023-01-15 — End: 2023-01-16
  Administered 2023-01-16: 100 ug via INTRAVENOUS
  Filled 2023-01-15: qty 2

## 2023-01-15 MED ORDER — LIDOCAINE HCL (PF) 1 % IJ SOLN: 30.0000 mL | INTRAMUSCULAR | Status: DC | PRN

## 2023-01-15 MED ORDER — PHENYLEPHRINE 80 MCG/ML (10ML) SYRINGE FOR IV PUSH (FOR BLOOD PRESSURE SUPPORT)
80.0000 ug | PREFILLED_SYRINGE | INTRAVENOUS | Status: DC | PRN
Start: 2023-01-15 — End: 2023-01-16
  Filled 2023-01-15: qty 10

## 2023-01-15 MED ORDER — SODIUM CHLORIDE 0.9 % IV SOLN
5.0000 10*6.[IU] | Freq: Once | INTRAVENOUS | Status: AC
  Administered 2023-01-15: 5 10*6.[IU] via INTRAVENOUS
  Filled 2023-01-15: qty 5

## 2023-01-15 MED ORDER — ACETAMINOPHEN 325 MG PO TABS
650.0000 mg | ORAL_TABLET | ORAL | Status: DC | PRN
Start: 2023-01-15 — End: 2023-01-16

## 2023-01-15 MED ORDER — PANTOPRAZOLE SODIUM 40 MG PO TBEC
40.0000 mg | DELAYED_RELEASE_TABLET | Freq: Every day | ORAL | Status: DC
  Administered 2023-01-15: 40 mg via ORAL
  Filled 2023-01-15: qty 1

## 2023-01-15 MED ORDER — MISOPROSTOL 25 MCG QUARTER TABLET
25.0000 ug | ORAL_TABLET | Freq: Once | ORAL | Status: AC
  Administered 2023-01-15: 25 ug via VAGINAL
  Filled 2023-01-15: qty 1

## 2023-01-15 MED ORDER — ONDANSETRON HCL 4 MG/2ML IJ SOLN
4.0000 mg | Freq: Four times a day (QID) | INTRAMUSCULAR | Status: DC | PRN
Start: 2023-01-15 — End: 2023-01-16
  Administered 2023-01-16: 4 mg via INTRAVENOUS
  Filled 2023-01-15: qty 2

## 2023-01-15 MED ORDER — OXYTOCIN BOLUS FROM INFUSION: 333.0000 mL | Freq: Once | INTRAVENOUS | Status: DC

## 2023-01-15 MED ORDER — OXYTOCIN-SODIUM CHLORIDE 30-0.9 UT/500ML-% IV SOLN: 2.5000 [IU]/h | INTRAVENOUS | Status: DC

## 2023-01-15 MED ORDER — LACTATED RINGERS IV SOLN: 500.0000 mL | Freq: Once | INTRAVENOUS | Status: DC

## 2023-01-15 MED ORDER — LACTATED RINGERS IV SOLN: INTRAVENOUS | Status: DC

## 2023-01-15 NOTE — Progress Notes (Signed)
   PRENATAL VISIT NOTE  Subjective:  Amanda Harper is a 27 y.o. G2P0010 at [redacted]w[redacted]d being seen today for ongoing prenatal care.  She is currently monitored for the following issues for this low-risk pregnancy and has Supervision of other normal pregnancy, antepartum; Polycystic disease, ovaries; Alpha thalassemia silent carrier; Obesity affecting pregnancy; and Group B Streptococcus carrier, antepartum on their problem list.  Patient reports occasional contractions.  Contractions: Not present. Vag. Bleeding: None.  Movement: Present. Denies leaking of fluid.   The following portions of the patient's history were reviewed and updated as appropriate: allergies, current medications, past family history, past medical history, past social history, past surgical history and problem list.   Objective:  There were no vitals filed for this visit.  Fetal Status: Fetal Heart Rate (bpm): NST   Movement: Present     General:  Alert, oriented and cooperative. Patient is in no acute distress.  Skin: Skin is warm and dry. No rash noted.   Cardiovascular: Normal heart rate noted  Respiratory: Normal respiratory effort, no problems with respiration noted  Abdomen: Soft, gravid, appropriate for gestational age.  Pain/Pressure: Absent     Pelvic: Cervical exam performed in the presence of a chaperone Dilation: 1 Effacement (%): 50 Station: -3  Extremities: Normal range of motion.  Edema: None  Mental Status: Normal mood and affect. Normal behavior. Normal judgment and thought content.   Assessment and Plan:  Pregnancy: G2P0010 at [redacted]w[redacted]d 1. Supervision of other normal pregnancy, antepartum --Pt to L&D for IOL, see below  2. Group B Streptococcus carrier, antepartum --PCN in labor  3. [redacted] weeks gestation of pregnancy --NST today with initial FHR down at 110, then baseline of 155 with accels for the rest of the monitoring.  BPP with 6/8 for breathing but with AFI on the low side of normal at 5.5.   --Pt to  L&D for IOL for 40+ weeks with slightly abnormal postdates antenatal testing --Discussed IOL with patient, expectations about medications, duration of labor, etc.  Questions answered.   Term labor symptoms and general obstetric precautions including but not limited to vaginal bleeding, contractions, leaking of fluid and fetal movement were reviewed in detail with the patient. Please refer to After Visit Summary for other counseling recommendations.   No follow-ups on file.  Future Appointments  Date Time Provider Department Center  01/21/2023  6:45 AM [facility] SCHED ROOM [facility] None    Sharen Counter, CNM

## 2023-01-15 NOTE — Telephone Encounter (Signed)
Preadmission screen  

## 2023-01-15 NOTE — H&P (Addendum)
OBSTETRIC ADMISSION HISTORY AND PHYSICAL  Amanda Harper is a 27 y.o. female G2P0010 with IUP at [redacted]w[redacted]d by early [place] presenting for NRFS.  BPP 6/8 (breathing) and low baseline on NST (110bpm) at 40weeks, decision made to DA for IOL. She reports +FMs, No LOF, no VB, no blurry vision, headaches or peripheral edema, and RUQ pain.  She plans on breast feeding. She request condoms for birth control. She received her prenatal care at [facility]   Dating: By early [place] --->  Estimated Date of Delivery: 01/14/23  Sono:    @[redacted]w[redacted]d , CWD, normal anatomy, cephalic presentation, anterior placental lie, 1923g, 25%% EFW   Prenatal History/Complications:  Patient Active Problem List   Diagnosis Date Noted   Post-dates pregnancy 01/15/2023   Group B Streptococcus carrier, antepartum 12/24/2022   Obesity affecting pregnancy 08/22/2022   Alpha thalassemia silent carrier 07/25/2022   Supervision of other normal pregnancy, antepartum 07/06/2022   Polycystic disease, ovaries 03/25/2012   NURSING  PROVIDER  Office Location [facility] Dating by U/S at 8 wks  [facility] Model Traditional Anatomy U/S Normal, f/u as indicated  Initiated care at  [employer]  English               LAB RESULTS   Support Person Ali(FOB) Genetics NIPS:   LR          AFP:  neg                NT/IT (FT only)        Carrier Screen Horizon: alpha thal carrier  Rhogam  O/Positive/-- (05/30 1132) A1C/GTT Early:   5.0          Third trimester: nl 2 hour  Flu Vaccine  10/31/22      TDaP Vaccine  10/11/22 Blood Type O/Positive/-- (05/30 1132)  Covid Vaccine 2- doses Antibody Negative (05/30 1132)      Rubella 1.66 (05/30 1132)  Feeding Plan Breast RPR Non Reactive (08/28 0828)  Contraception condoms HBsAg Negative (05/30 1132)  Circumcision Yes HIV Non Reactive (08/28 0828)  Pediatrician  [facility] HCVAb Non Reactive (05/30 1132)  Prenatal Classes            Pap       Diagnosis  Date Value Ref  Range Status  12/19/2022     Final    - Negative for intraepithelial lesion or malignancy (NILM)    BTLConsent na GC/CT Initial:  neg   36wks: neg/neg  VBAC  Consent na GBS Positive/-- (11/05 0348) For PCN allergy, check sensitivities            DME Rx [x]  BP cuff [ ]  Weight Scale Waterbirth  [ ]  Class [ ]  Consent [ ]  CNM visit  PHQ9 & GAD7 [x]  new OB [  ] 28 weeks  [  ] 36 weeks Induction  [ ]  Orders Entered [ ] Foley Y/N     Past Medical History: Past Medical History:  Diagnosis Date   Acne    GERD (gastroesophageal reflux disease)    Helicobacter pylori gastritis 07/11/2011   Noise-induced hearing loss of both ears 11/15/2015   Osgood-Schlatter's disease of left knee    Patella alta 11/05/2017   PCOS (polycystic ovarian syndrome)    PCOS (polycystic ovarian syndrome)     Past Surgical History: Past Surgical History:  Procedure Laterality Date   NO PAST  SURGERIES      Obstetrical History: OB History     Gravida  2   Para      Term      Preterm      AB  1   Living  0      SAB  1   IAB      Ectopic      Multiple      Live Births              Social History Social History   Socioeconomic History   Marital status: Married    Spouse name: Not on file   Number of children: Not on file   Years of education: Not on file   Highest education level: Not on file  Occupational History   Not on file  Tobacco Use   Smoking status: Never   Smokeless tobacco: Never  Vaping Use   Vaping status: Never Used  Substance and Sexual Activity   Alcohol use: No   Drug use: No   Sexual activity: Yes    Birth control/protection: None  Other Topics Concern   Not on file  Social History Narrative   Not on file   Social Determinants of Health   Financial Resource Strain: Not on file  Food Insecurity: No Food Insecurity (07/13/2022)   Hunger Vital Sign    Worried About Running Out of Food in the Last Year: Never true    Ran Out of Food in the Last  Year: Never true  Transportation Needs: No Transportation Needs (07/13/2022)   PRAPARE - Administrator, Civil Service (Medical): No    Lack of Transportation (Non-Medical): No  Physical Activity: Not on file  Stress: Not on file  Social Connections: Not on file    Family History: Family History  Problem Relation Age of Onset   Arthritis Mother    Arthritis Father    Heart disease Father    Hypertension Father    Hypertension Sister    Hypertension Maternal Grandmother    Hypertension Maternal Grandfather    Diabetes Maternal Grandfather    Hyperlipidemia Paternal Grandmother    Cancer Neg Hx    Alcohol abuse Neg Hx    Drug abuse Neg Hx    Early death Neg Hx    Kidney disease Neg Hx     Allergies: Allergies  Allergen Reactions   Metformin And Related     dizzy   Pork-Derived Products     religious   Other Rash    Henna dye    Medications Prior to Admission  Medication Sig Dispense Refill Last Dose   acetaminophen (TYLENOL) 500 MG tablet Take 2 tablets (1,000 mg total) by mouth every 6 (six) hours as needed. (Patient not taking: Reported on 01/15/2023) 30 tablet 0    aspirin EC 81 MG tablet Take 1 tablet (81 mg total) by mouth daily. Start taking when you are [redacted] weeks pregnant for rest of pregnancy for prevention of preeclampsia 300 tablet 2    omeprazole (PRILOSEC) 40 MG capsule Take 1 capsule (40 mg total) by mouth daily. 90 capsule 3    Prenatal 27-1 MG TABS TAKE 1 TABLET BY MOUTH EVERY Amanda 90 tablet 1      Review of Systems   All systems reviewed and negative except as stated in HPI  There were no vitals taken for this visit. General appearance: alert, cooperative, and appears stated age Lungs: clear to auscultation bilaterally Heart:  regular rate and rhythm Abdomen: soft, non-tender; bowel sounds normal Pelvic: normal female genitalia  Extremities: Homans sign is negative, no sign of DVT Presentation: cephalic Fetal monitoringBaseline: 130  bpm, Variability: Good {> 6 bpm), Accelerations: Reactive, and Decelerations: Absent Uterine activityNone     Prenatal labs: ABO, Rh: O/Positive/-- (05/30 1132) Antibody: Negative (05/30 1132) Rubella: 1.66 (05/30 1132) RPR: Non Reactive (08/28 0828)  HBsAg: Negative (05/30 1132)  HIV: Non Reactive (08/28 0828)  GBS: Positive/-- (11/05 0348)  A1c 5.0, 2hr Glucola normal  Genetic screening  LR female, AFP neg, Horizon Chartered loss adjuster [place] normal  Prenatal Transfer Tool  Maternal Diabetes: No Genetic Screening: Abnormal:  Results: Other: Alpha Thal Carrier Maternal Ultrasounds/Referrals: Other: resolved low lying placenta  Fetal Ultrasounds or other Referrals:  None Maternal Substance Abuse:  No Significant Maternal Medications:  None Significant Maternal Lab Results:  Group B Strep positive Number of Prenatal Visits:greater than 3 verified prenatal visits Other Comments:   Low lying placenta (resolved)   No results found for this or any previous visit (from the past 24 hour(s)).  Patient Active Problem List   Diagnosis Date Noted   Post-dates pregnancy 01/15/2023   Group B Streptococcus carrier, antepartum 12/24/2022   Obesity affecting pregnancy 08/22/2022   Alpha thalassemia silent carrier 07/25/2022   Supervision of other normal pregnancy, antepartum 07/06/2022   Polycystic disease, ovaries 03/25/2012    Assessment/Plan:  Amanda Harper is a 27 y.o. G2P0010 at [redacted]w[redacted]d here for IOL 2/2 NRFS.   #Labor: Vaginal Cytotec given NRFS earlier today then possible repeat vs FB vs AROM vs Pit at next check  #Pain: Per patient request #FWB: Cat 1 #ID:  GBS positive #MOF: breast #MOC:condoms #Circ:  Yes  Hessie Dibble, MD  01/15/2023, 6:52 PM

## 2023-01-16 ENCOUNTER — Encounter (HOSPITAL_COMMUNITY): Payer: Self-pay | Admitting: Family Medicine

## 2023-01-16 ENCOUNTER — Inpatient Hospital Stay (HOSPITAL_COMMUNITY): Payer: Medicaid Other | Admitting: Anesthesiology

## 2023-01-16 DIAGNOSIS — O480 Post-term pregnancy: Secondary | ICD-10-CM

## 2023-01-16 DIAGNOSIS — O99820 Streptococcus B carrier state complicating pregnancy: Secondary | ICD-10-CM

## 2023-01-16 DIAGNOSIS — Z3A40 40 weeks gestation of pregnancy: Secondary | ICD-10-CM

## 2023-01-16 DIAGNOSIS — O99214 Obesity complicating childbirth: Secondary | ICD-10-CM

## 2023-01-16 LAB — RPR: RPR Ser Ql: NONREACTIVE

## 2023-01-16 MED ORDER — ONDANSETRON HCL 4 MG PO TABS: 4.0000 mg | ORAL_TABLET | ORAL | Status: DC | PRN

## 2023-01-16 MED ORDER — OXYCODONE HCL 5 MG PO TABS: 5.0000 mg | ORAL_TABLET | ORAL | Status: DC | PRN

## 2023-01-16 MED ORDER — PRENATAL MULTIVITAMIN CH
1.0000 | ORAL_TABLET | Freq: Every day | ORAL | Status: DC
  Administered 2023-01-17 – 2023-01-18 (×2): 1 via ORAL
  Filled 2023-01-16 (×2): qty 1

## 2023-01-16 MED ORDER — TERBUTALINE SULFATE 1 MG/ML IJ SOLN: 0.2500 mg | Freq: Once | INTRAMUSCULAR | Status: DC | PRN

## 2023-01-16 MED ORDER — ERYTHROMYCIN 5 MG/GM OP OINT
TOPICAL_OINTMENT | OPHTHALMIC | Status: AC
  Filled 2023-01-16: qty 1

## 2023-01-16 MED ORDER — LACTATED RINGERS AMNIOINFUSION: INTRAVENOUS | Status: DC

## 2023-01-16 MED ORDER — OXYTOCIN-SODIUM CHLORIDE 30-0.9 UT/500ML-% IV SOLN
1.0000 m[IU]/min | INTRAVENOUS | Status: DC
  Filled 2023-01-16: qty 500

## 2023-01-16 MED ORDER — COCONUT OIL OIL: 1.0000 | TOPICAL_OIL | Status: DC | PRN

## 2023-01-16 MED ORDER — TETANUS-DIPHTH-ACELL PERTUSSIS 5-2.5-18.5 LF-MCG/0.5 IM SUSY: 0.5000 mL | PREFILLED_SYRINGE | Freq: Once | INTRAMUSCULAR | Status: DC

## 2023-01-16 MED ORDER — SIMETHICONE 80 MG PO CHEW: 80.0000 mg | CHEWABLE_TABLET | ORAL | Status: DC | PRN

## 2023-01-16 MED ORDER — IBUPROFEN 600 MG PO TABS
600.0000 mg | ORAL_TABLET | Freq: Four times a day (QID) | ORAL | Status: DC
  Administered 2023-01-16 – 2023-01-18 (×8): 600 mg via ORAL
  Filled 2023-01-16 (×8): qty 1

## 2023-01-16 MED ORDER — DIBUCAINE (PERIANAL) 1 % EX OINT: 1.0000 | TOPICAL_OINTMENT | CUTANEOUS | Status: DC | PRN

## 2023-01-16 MED ORDER — LIDOCAINE HCL (PF) 1 % IJ SOLN
INTRAMUSCULAR | Status: DC | PRN
  Administered 2023-01-16: 8 mL via EPIDURAL

## 2023-01-16 MED ORDER — SENNOSIDES-DOCUSATE SODIUM 8.6-50 MG PO TABS
2.0000 | ORAL_TABLET | Freq: Every day | ORAL | Status: DC
  Administered 2023-01-17 – 2023-01-18 (×2): 2 via ORAL
  Filled 2023-01-16 (×2): qty 2

## 2023-01-16 MED ORDER — DIPHENHYDRAMINE HCL 50 MG/ML IJ SOLN: 12.5000 mg | INTRAMUSCULAR | Status: DC | PRN

## 2023-01-16 MED ORDER — ACETAMINOPHEN 325 MG PO TABS: 650.0000 mg | ORAL_TABLET | ORAL | Status: DC | PRN

## 2023-01-16 MED ORDER — FAMOTIDINE IN NACL 20-0.9 MG/50ML-% IV SOLN
20.0000 mg | Freq: Once | INTRAVENOUS | Status: DC
  Filled 2023-01-16: qty 50

## 2023-01-16 MED ORDER — WITCH HAZEL-GLYCERIN EX PADS: 1.0000 | MEDICATED_PAD | CUTANEOUS | Status: DC | PRN

## 2023-01-16 MED ORDER — OXYCODONE HCL 5 MG PO TABS: 10.0000 mg | ORAL_TABLET | ORAL | Status: DC | PRN

## 2023-01-16 MED ORDER — DIPHENHYDRAMINE HCL 25 MG PO CAPS: 25.0000 mg | ORAL_CAPSULE | Freq: Four times a day (QID) | ORAL | Status: DC | PRN

## 2023-01-16 MED ORDER — ONDANSETRON HCL 4 MG/2ML IJ SOLN: 4.0000 mg | INTRAMUSCULAR | Status: DC | PRN

## 2023-01-16 MED ORDER — FENTANYL-BUPIVACAINE-NACL 0.5-0.125-0.9 MG/250ML-% EP SOLN: 12.0000 mL/h | EPIDURAL | Status: DC | PRN

## 2023-01-16 MED ORDER — BENZOCAINE-MENTHOL 20-0.5 % EX AERO
1.0000 | INHALATION_SPRAY | CUTANEOUS | Status: DC | PRN
  Administered 2023-01-16: 1 via TOPICAL
  Filled 2023-01-16: qty 56

## 2023-01-16 NOTE — Lactation Note (Signed)
This note was copied from a baby's chart. Lactation Consultation Note  Patient Name: Amanda Harper [id number] Date: 01/16/2023 Age:27 hours Reason for consult: Initial assessment;Primapara;1st time breastfeeding;Term;Nipple pain/trauma;Maternal endocrine disorder  P1- MOB reports that infant nurses well with assistance from the RNs, but when she attempts to latch it is pinchy and painful. Infant had just finished nursing 20 minutes ago, so LC encouraged MOB to call out for infant's next feeding for assistance. LC reviewed positioning techniques to ensure a deeper latch: belly to belly and nose to nipple. LC also reviewed stroking the nipple from nose to chin to elicit a gaping mouth. LC also encouraged MOB to bring infant to breast, instead of her breast to infant. MOB verbalized agreement and understanding.  LC reviewed feeding infant on cue 8-12x in 24 hrs, not allowing infant to go over 3 hrs without a feeding, CDC milk storage guidelines and LC services handout. LC encouraged MOB to call out for further assistance as needed.  Maternal Data Has patient been taught Hand Expression?: No Does the patient have breastfeeding experience prior to this delivery?: No  Feeding Mother's Current Feeding Choice: Breast Milk  Lactation Tools Discussed/Used Pump Education: Milk Storage  Interventions Interventions: Breast feeding basics reviewed;Education;LC Services brochure  Discharge Discharge Education: Warning signs for feeding baby Pump: DEBP;Personal  Consult Status Consult Status: Follow-up Date: 01/17/23 Follow-up type: In-patient    Dema Severin BS, IBCLC 01/16/2023, 9:26 PM

## 2023-01-16 NOTE — Progress Notes (Signed)
Labor Progress Note Amanda Harper is a 27 y.o. G2P0010 at [redacted]w[redacted]d presented for IOL for NRFS S: Coping well with contractions every 5 minutes. Discussed role for FB and/or continued cytotec vs pitocin; agreeable to AROM.   O:  BP 120/73   Pulse 67   Temp 98.6 F (37 C)   Resp 18   LMP  (Approximate) Comment: no period since miscarriage,  bled 2/7-2/29  EFM: baseline 150, accels, no decels, moderate variability TOCO: no contractions  CVE: Dilation: 2 Effacement (%): 60 Station: -3 Presentation: Vertex Exam by:: Smalley RN   A&P: 27 y.o. G2P0010 [redacted]w[redacted]d admitted for IOL #Labor: AROM clear, small. Discussed role for Pitocin if contractions don't pick up and become regular. Pt agreeable.  #Pain: Per pt request #FWB: Cat I #GBS positive, continue PCN  Wyn Forster, MD 1:46 AM

## 2023-01-16 NOTE — Anesthesia Preprocedure Evaluation (Signed)
Anesthesia Evaluation  Patient identified by MRN, date of birth, ID band Patient awake    Reviewed: Allergy & Precautions, H&P , NPO status , Patient's Chart, lab work & pertinent test results, reviewed documented beta blocker date and time   Airway Mallampati: I  TM Distance: >3 FB Neck ROM: full    Dental no notable dental hx. (+) Teeth Intact, Dental Advisory Given   Pulmonary neg pulmonary ROS   Pulmonary exam normal breath sounds clear to auscultation       Cardiovascular negative cardio ROS Normal cardiovascular exam Rhythm:regular Rate:Normal     Neuro/Psych negative neurological ROS  negative psych ROS   GI/Hepatic negative GI ROS, Neg liver ROS,GERD  Medicated and Controlled,,  Endo/Other  negative endocrine ROS    Renal/GU negative Renal ROS  negative genitourinary   Musculoskeletal   Abdominal   Peds  Hematology negative hematology ROS (+)   Anesthesia Other Findings   Reproductive/Obstetrics (+) Pregnancy                             Anesthesia Physical Anesthesia Plan  ASA: 2  Anesthesia Plan: Epidural   Post-op Pain Management: Minimal or no pain anticipated   Induction: Intravenous  PONV Risk Score and Plan: 2 and Treatment may vary due to age or medical condition  Airway Management Planned: Natural Airway and Simple Face Mask  Additional Equipment: None  Intra-op Plan:   Post-operative Plan:   Informed Consent: I have reviewed the patients History and Physical, chart, labs and discussed the procedure including the risks, benefits and alternatives for the proposed anesthesia with the patient or authorized representative who has indicated his/her understanding and acceptance.     Dental Advisory Given  Plan Discussed with: Anesthesiologist and CRNA  Anesthesia Plan Comments: (Labs checked- platelets confirmed with RN in room. Fetal heart tracing, per RN,  reported to be stable enough for sitting procedure. Discussed epidural, and patient consents to the procedure:  included risk of possible headache,backache, failed block, allergic reaction, and nerve injury. This patient was asked if she had any questions or concerns before the procedure started.)       Anesthesia Quick Evaluation

## 2023-01-16 NOTE — Progress Notes (Addendum)
Labor Progress Note Amanda Harper is a 27 y.o. G2P0010 at [redacted]w[redacted]d presented for IOL for NRFS  S: coping well with contractions, had a nap overnight. Discussed role, risks and benefits of IUPC to enable accurate measurement of contractions and timing of fetal decels; pt agreeable.   O:  BP 110/79   Pulse (!) 107   Temp 98.8 F (37.1 C) (Oral)   Resp 18   LMP  (Approximate) Comment: no period since miscarriage,  bled 2/7-2/29  SpO2 99%   EFM: baseline 130, accels, shallow early decels, moderate variability TOCO: q3-73min contractions  CVE: Dilation: 5 Effacement (%): 80 Station: -3 Presentation: Vertex Exam by:: Dr. Leanora Cover   A&P: 27 y.o. G2P0010 [redacted]w[redacted]d admitted for IOL #Labor: Progressing slowly. Strongly desires to avoid pitocin if possible. IUPC placed. Briefly discussed amnioinfusion; agreeable if deems appropriate.  #Pain: Epidural #FWB: Cat II -> I; overall reassuring #GBS positive; PCN  Amanda Forster, MD 6:42 AM

## 2023-01-16 NOTE — Progress Notes (Signed)
Labor Progress Note  Amanda Harper is a 27 y.o. G2P0010 at [redacted]w[redacted]d presented for IOL for NRFS   S: Doing well, resting, epidural is working well for her.   O:  BP (!) 104/58   Pulse 80   Temp 98.8 F (37.1 C) (Oral)   Resp 18   LMP  (Approximate) Comment: no period since miscarriage,  bled 2/7-2/29  SpO2 98%  EFM:130 bpm/Moderate variability/ 15x15 accels/ None decels CAT: 1 Toco: regular, every 3 minutes   CVE: Dilation: 9 Effacement (%): 80 Station: -2 Presentation: Vertex Exam by:: resident-Benna Arno   A&P: 27 y.o. G2P0010 [redacted]w[redacted]d  here for IOL for NRFS  as above  #Labor: Progressing well. IUPC in place. Making cervical change with good contraction pattern.  #Pain: Epidural #FWB: CAT 1  #GBS positive on PCN   Continue expectant management. Anticipate SVD   Nelta Numbers, MD FM Visiting Resident PGY3  Faculty practice [facility], Center for [facility] 01/16/23  10:16 AM

## 2023-01-16 NOTE — Discharge Summary (Signed)
Postpartum Discharge Summary     Patient Name: Amanda Harper DOB: Jul 19, 1995 MRN: [id number]  Date of admission: 01/15/2023 Delivery date:01/16/2023 Delivering provider: Cam Hai D Date of discharge: 01/18/2023  Admitting diagnosis: Post-dates pregnancy [O48.0] Intrauterine pregnancy: [redacted]w[redacted]d     Secondary diagnosis:  Principal Problem:   Post-dates pregnancy Active Problems:   Alpha thalassemia silent carrier   Obesity affecting pregnancy   Group B Streptococcus carrier, antepartum  Additional problems: none    Discharge diagnosis: Term Pregnancy Delivered                                              Post partum procedures: none Augmentation: AROM and Cytotec Complications: None  Hospital course: Induction of Labor With Vaginal Delivery   27 y.o. yo G2P1011 at [redacted]w[redacted]d was admitted to the hospital 01/15/2023 for induction of labor.  Indication for induction:  non-reactive NST (low baseline of 110 with BPP 6/8) .  Patient had an uncomplicated labor course. Membrane Rupture Time/Date: 1:56 AM,01/16/2023  Delivery Method:Vaginal, Spontaneous Operative Delivery:N/A Episiotomy: None Lacerations:  1st degree;Labial Details of delivery can be found in separate delivery note.  Patient had an uncomplicated postpartum course. Patient is discharged home 01/18/23.  Newborn Data: Birth date:01/16/2023 Birth time:12:58 PM Gender:Female Living status:Living Apgars:7 ,10  Weight:3232 g (7lb 2oz)  Magnesium Sulfate received: No BMZ received: No Rhophylac:N/A MMR:N/A T-DaP:Given prenatally Flu: Yes RSV Vaccine received: Yes Transfusion:No  Immunizations received: Immunization History  Administered Date(s) Administered   Influenza, Seasonal, Injecte, Preservative Fre 10/31/2022   Influenza,inj,Quad PF,6+ Mos 10/25/2015, 11/27/2016, 10/10/2017   PFIZER(Purple Top)SARS-COV-2 Vaccination 05/08/2019, 06/02/2019   Rsv, Bivalent, Protein Subunit Rsvpref,pf Verdis Frederickson) 12/12/2022    Tdap 09/07/2010, 10/25/2015, 10/11/2022    Physical exam  Vitals:   01/17/23 0436 01/17/23 1350 01/17/23 2024 01/18/23 0649  BP: (!) 102/56 113/82 108/80 109/68  Pulse: 71 78 98 66  Resp: 18 18 19 18   Temp: 98.5 F (36.9 C) 98.3 F (36.8 C) 98 F (36.7 C) 98.8 F (37.1 C)  TempSrc: Oral Oral Oral Oral  SpO2: 100%  100% 100%   General: alert and cooperative Lochia: appropriate Uterine Fundus: firm Incision: N/A DVT Evaluation: No evidence of DVT seen on physical exam. Labs: Lab Results  Component Value Date   WBC 6.8 01/15/2023   HGB 12.4 01/15/2023   HCT 38.7 01/15/2023   MCV 79.8 (L) 01/15/2023   PLT 169 01/15/2023      Latest Ref Rng & Units 02/25/2022    4:01 PM  CMP  Glucose 70 - 99 mg/dL 96   BUN 6 - 20 mg/dL 9   Creatinine 1.32 - 4.40 mg/dL 1.02   Sodium 725 - 366 mmol/L 137   Potassium 3.5 - 5.1 mmol/L 3.6   Chloride 98 - 111 mmol/L 105   CO2 22 - 32 mmol/L 24   Calcium 8.9 - 10.3 mg/dL 9.1    Edinburgh Score:    01/17/2023   11:41 PM  Edinburgh Postnatal Depression Scale Screening Tool  I have been able to laugh and see the funny side of things. 0  I have looked forward with enjoyment to things. 0  I have blamed myself unnecessarily when things went wrong. 0  I have been anxious or worried for no good reason. 0  I have felt scared or panicky for no good reason. 0  Things have been getting on top of me. 0  I have been so unhappy that I have had difficulty sleeping. 0  I have felt sad or miserable. 0  I have been so unhappy that I have been crying. 0  The thought of harming myself has occurred to me. 0  Edinburgh Postnatal Depression Scale Total 0   Edinburgh Postnatal Depression Scale Total: 0   After visit meds:  Allergies as of 01/18/2023       Reactions   Pork-derived Products    religious   Other Rash   Henna dye        Medication List     STOP taking these medications    acetaminophen 500 MG tablet Commonly known as: TYLENOL    aspirin EC 81 MG tablet   omeprazole 40 MG capsule Commonly known as: PRILOSEC       TAKE these medications    ibuprofen 600 MG tablet Commonly known as: ADVIL Take 1 tablet (600 mg total) by mouth every 6 (six) hours as needed.   Prenatal 27-1 MG Tabs TAKE 1 TABLET BY MOUTH EVERY DAY         Discharge home in stable condition Infant Feeding: Breast Infant Disposition:rooming in (under bili-lights) Discharge instruction: per After Visit Summary and Postpartum booklet. Activity: Advance as tolerated. Pelvic rest for 6 weeks.  Diet: routine diet Future Appointments:No future appointments. Follow up Visit:  Arabella Merles, CNM  P [facility] Admin Pool Please schedule this patient for Postpartum visit in: 6 weeks with the following provider: Any provider In-Person For C/S patients schedule nurse incision check in weeks 2 weeks: no Low risk pregnancy complicated by: none Delivery mode:  SVD Anticipated Birth Control:  Condoms PP Procedures needed: none Schedule Integrated BH visit: no   01/18/2023 Arabella Merles, CNM 8:17 AM

## 2023-01-16 NOTE — Progress Notes (Signed)
Patient was assessed and managed by nursing staff during this encounter. I have reviewed the chart and agree with the documentation and plan. I have also made any necessary editorial changes. Note previously signed in AS  Warden Fillers, MD 01/16/2023 1:40 PM

## 2023-01-17 MED ORDER — PANTOPRAZOLE SODIUM 40 MG PO TBEC
40.0000 mg | DELAYED_RELEASE_TABLET | Freq: Every day | ORAL | Status: DC
  Administered 2023-01-17 (×2): 40 mg via ORAL
  Filled 2023-01-17 (×2): qty 1

## 2023-01-17 NOTE — Progress Notes (Signed)
POSTPARTUM PROGRESS NOTE  Post Partum Day #1  Subjective:  Amanda Harper is a 27 y.o. G2P1011 s/p NSVD at [redacted]w[redacted]d.  She reports she is doing well. No acute events overnight. She denies any problems with ambulating, voiding or po intake. Denies nausea or vomiting.  Pain is moderately controlled.  Lochia is appropriate.  Objective: Blood pressure (!) 102/56, pulse 71, temperature 98.5 F (36.9 C), temperature source Oral, resp. rate 18, SpO2 100%, unknown if currently breastfeeding.  Physical Exam:  General: alert, cooperative and no distress Chest: no respiratory distress Heart:regular rate, distal pulses intact Abdomen: soft, nontender,  Uterine Fundus: firm, appropriately tender DVT Evaluation: No calf swelling or tenderness Extremities: no LE edema Skin: warm, dry  Recent Labs    01/15/23 1900  HGB 12.4  HCT 38.7    Assessment/Plan: Amanda Harper is a 27 y.o. G2P1011 s/p NSVD at [redacted]w[redacted]d   PPD#1 - Doing well  Routine postpartum care Contraception: condoms Feeding: breast Dispo: Plan for discharge PPD#2.   LOS: 2 days    Venora Maples, MD/MPH Attending Family Medicine Physician, [facility] for [facility], [facility]  01/17/2023, 10:33 AM

## 2023-01-17 NOTE — Anesthesia Postprocedure Evaluation (Signed)
Anesthesia Post Note  Patient: Amanda Harper  Procedure(s) Performed: AN AD HOC LABOR EPIDURAL     Patient location during evaluation: Mother Baby Anesthesia Type: Epidural Level of consciousness: awake and alert Pain management: pain level controlled Vital Signs Assessment: post-procedure vital signs reviewed and stable Respiratory status: spontaneous breathing, nonlabored ventilation and respiratory function stable Cardiovascular status: stable Postop Assessment: no headache, no backache and epidural receding Anesthetic complications: no   No notable events documented.  Last Vitals:  Vitals:   01/17/23 0002 01/17/23 0436  BP: 102/74 (!) 102/56  Pulse: 68 71  Resp: 18 18  Temp: 36.8 C 36.9 C  SpO2: 100% 100%    Last Pain:  Vitals:   01/17/23 0436  TempSrc: Oral  PainSc: 0-No pain   Pain Goal:                   Rica Records

## 2023-01-17 NOTE — Lactation Note (Signed)
This note was copied from a baby's chart. Lactation Consultation Note  Patient Name: Amanda Harper ZOXWR'U Date: 01/17/2023 Age:26 hours  Reason for consult: Follow-up assessment;Primapara;Term;Mother's request;Breastfeeding assistance;Hyperbilirubinemia;1st time breastfeeding  P1, [redacted]w[redacted]d, 4% weight loss  Mother was receptive to [facility] visit. Mother says baby has been breastfeeding well up to his circumcision. Baby has been sleepy when she attempted twice to feed him. Baby is currently receiving double phototherapy that was started today and he is showing feeding cues.   Assisted mother in cross cradle hold with basic breastfeeding education. She is able to express colostrum with ease. Baby latched well with wide gape, rhythmic sucking with swallowing noted. Mother was taught breast compression to increase colostrum intake. Baby was still feeding when LC exited room.  Baby is presently feeding without phototherapy at this time. Discussed how she can latch baby then place single paddle light on infant's back while feeding. Instructed to keep eye shield on baby when phototherapy is on " Amanda Harper". Call nurse for assistance, as needed.  Instructed to feed baby with cues, at least every 3 hours and baby may want to feed frequently now that he is 24 hours old.    Maternal Data Has patient been taught Hand Expression?: Yes      LATCH Score Latch: Grasps breast easily, tongue down, lips flanged, rhythmical sucking.  Audible Swallowing: A few with stimulation  Type of Nipple: Everted at rest and after stimulation  Comfort (Breast/Nipple): Soft / non-tender  Hold (Positioning): Assistance needed to correctly position infant at breast and maintain latch.  LATCH Score: 8   Interventions Interventions: Breast feeding basics reviewed;Assisted with latch;Skin to skin;Hand express;Breast compression;Support pillows;Position options;Education    Consult Status Consult Status:  Follow-up Date: 01/18/23 Follow-up type: In-patient    Christella Hartigan M 01/17/2023, 2:39 PM

## 2023-01-18 LAB — BIRTH TISSUE RECOVERY COLLECTION (PLACENTA DONATION)

## 2023-01-18 MED ORDER — IBUPROFEN 600 MG PO TABS: 600.0000 mg | ORAL_TABLET | Freq: Four times a day (QID) | ORAL | 0 refills | Status: DC | PRN

## 2023-01-18 NOTE — Lactation Note (Signed)
This note was copied from a baby's chart. Lactation Consultation Note  Patient Name: Amanda Harper ZOXWR'U Date: 01/18/2023 Age:27 hours  Reason for consult: Follow-up assessment;1st time breastfeeding;Term;Primapara  P1, [redacted]w[redacted]d, phototherapy discontinued  Follow up LC visit. Parents appear exhausted and report poor sleep due to phototherapy and infant wanting to feed frequently. Phototherapy has been discontinued and labs pending. Mother is hopeful to be discharged this evening. Baby has been supplemented with formula due to jaundice and mother's request. She states "Nonnie Done" is still breastfeeding well. Parents plan to breast feed and supplement with EBM and formula until mother gets some rest and her milk comes to volume.    Discussed the process of milk production, "supply and demand" and the importance of breast stimulation and milk removal in order to make an optimal milk supply.  Discussed mother to breastfeed 8-12 times in 24 hours, skin to skin and breast feed before formula feeding.   If missed feedings at breast or substituting feeding with formula, advised to hand express and/or pump to remove milk from the breast.    Feeding Mother's Current Feeding Choice: Breast Milk and Formula  Interventions Interventions: Education  Discharge Discharge Education: Engorgement and breast care;Warning signs for feeding baby  Consult Status Consult Status: Complete Date: 01/18/23    Omar Person 01/18/2023, 4:41 PM

## 2023-01-21 ENCOUNTER — Inpatient Hospital Stay (HOSPITAL_COMMUNITY): Payer: Medicaid Other

## 2023-01-27 ENCOUNTER — Telehealth (HOSPITAL_COMMUNITY): Payer: Self-pay

## 2023-01-27 NOTE — Telephone Encounter (Signed)
01/27/2023 0957  Name: Amanda Harper MRN: [id number] DOB: [birth year]  Reason for Call:  Transition of Care Hospital Discharge Call  Contact Status: Patient Contact Status: Complete  Language assistant needed:          Follow-Up Questions: Do You Have Any Concerns About Your Health As You Heal From Delivery?: No Do You Have Any Concerns About Your Infants Health?: No  Edinburgh Postnatal Depression Scale:  In the Past 7 Days: I have been able to laugh and see the funny side of things.: As much as I always could I have looked forward with enjoyment to things.: As much as I ever did I have blamed myself unnecessarily when things went wrong.: No, never I have been anxious or worried for no good reason.: No, not at all I have felt scared or panicky for no good reason.: No, not at all Things have been getting on top of me.: No, I have been coping as well as ever I have been so unhappy that I have had difficulty sleeping.: Not at all I have felt sad or miserable.: No, not at all I have been so unhappy that I have been crying.: No, never The thought of harming myself has occurred to me.: Never Inocente Salles Postnatal Depression Scale Total: 0  PHQ2-9 Depression Scale:     Discharge Follow-up: Edinburgh score requires follow up?: No Patient was advised of the following resources:: Breastfeeding Support Group, Support Group  Post-discharge interventions: Reviewed Newborn Safe Sleep Practices  Signature  Signe Colt

## 2023-02-09 ENCOUNTER — Encounter: Payer: Self-pay | Admitting: Family Medicine

## 2023-02-12 ENCOUNTER — Other Ambulatory Visit: Payer: Self-pay

## 2023-02-12 DIAGNOSIS — Z3402 Encounter for supervision of normal first pregnancy, second trimester: Secondary | ICD-10-CM

## 2023-02-12 MED ORDER — PRENATAL 27-1 MG PO TABS: 1.0000 | ORAL_TABLET | Freq: Every day | ORAL | 1 refills | Status: DC

## 2023-02-26 ENCOUNTER — Other Ambulatory Visit: Payer: Self-pay

## 2023-02-26 ENCOUNTER — Ambulatory Visit: Payer: Medicaid Other | Admitting: Obstetrics and Gynecology

## 2023-02-26 ENCOUNTER — Encounter: Payer: Self-pay | Admitting: Obstetrics and Gynecology

## 2023-02-26 NOTE — Progress Notes (Signed)
Pt does not want to discuss Birth Control today.

## 2023-02-26 NOTE — Progress Notes (Signed)
    Post Partum Visit Note  Amanda Harper is a 28 y.o. G18P1011 female who presents for a postpartum visit. She is 5 weeks postpartum following a normal spontaneous vaginal delivery.  I have fully reviewed the prenatal and intrapartum course. The delivery was at 40/2 gestational weeks.  Anesthesia: epidural. Postpartum course has been good. Baby is doing well. Baby is feeding by both breast and bottle - Similac Sensitive RS. Bleeding no bleeding. Bowel function is normal. Bladder function is normal. Patient is not sexually active. Contraception method is condoms. Postpartum depression screening: negative.   The pregnancy intention screening data noted above was reviewed. Potential methods of contraception were discussed. The patient elected to proceed with condoms    Health Maintenance Due  Topic Date Due   COVID-19 Vaccine (3 - 2024-25 season) 10/15/2022    The following portions of the patient's history were reviewed and updated as appropriate: allergies, current medications, past family history, past medical history, past social history, past surgical history, and problem list.  Review of Systems Pertinent items are noted in HPI.  Objective:  LMP  (Approximate) Comment: no period since miscarriage,  bled 2/7-2/29   General:  alert and cooperative   Breasts:  not indicated  Lungs: Normal effort  Heart:  Normal rate  Abdomen: soft    Wound N/a  GU exam:  not indicated       Assessment:   1. Postpartum examination following vaginal delivery (Primary) Letter given to return to work. Cannot guarantee how extension effect job and leave.Job is ok extending her leave a few weeks    Plan:   Essential components of care per ACOG recommendations:  1.  Mood and well being: Patient with negative depression screening today. Reviewed local resources for support.  - Patient tobacco use? No.   - hx of drug use? No.    2. Infant care and feeding:  -Patient currently breastmilk  feeding? Yes. Reviewed importance of draining breast regularly to support lactation.  -Social determinants of health (SDOH) reviewed in EPIC. No concerns  3. Sexuality, contraception and birth spacing - Patient does not want a pregnancy in the next year.  Desired family size is  children.  - Reviewed reproductive life planning. Reviewed contraceptive methods based on pt preferences and effectiveness.  Patient desired Female Condom today.   - Discussed birth spacing of 18 months  4. Sleep and fatigue -Encouraged family/partner/community support of 4 hrs of uninterrupted sleep to help with mood and fatigue  5. Physical Recovery  - Discussed patients delivery and complications. She describes her labor as good. - Patient had a Vaginal, no problems at delivery. Patient had a 1st degree laceration. Perineal healing reviewed. Patient expressed understanding - Patient has urinary incontinence? No. - Patient is safe to resume physical and sexual activity  6.  Health Maintenance - HM due items addressed Yes - Last pap smear  Diagnosis  Date Value Ref Range Status  12/19/2022   Final   - Negative for intraepithelial lesion or malignancy (NILM)   Pap smear not done at today's visit.  -Breast Cancer screening indicated? No.   7. Chronic Disease/Pregnancy Condition follow up: None  - PCP follow up  Nidia Daring, FNP Center for [employer], [facility]

## 2023-04-08 ENCOUNTER — Encounter: Payer: Self-pay | Admitting: Family Medicine

## 2023-04-23 NOTE — Telephone Encounter (Signed)
 Answer Assessment - Initial Assessment Questions Error  Protocols used: Blood Pressure - High-A-AH  This encounter was created in error - please disregard.

## 2023-04-24 ENCOUNTER — Ambulatory Visit (HOSPITAL_BASED_OUTPATIENT_CLINIC_OR_DEPARTMENT_OTHER): Admitting: Family Medicine

## 2023-05-24 ENCOUNTER — Ambulatory Visit (INDEPENDENT_AMBULATORY_CARE_PROVIDER_SITE_OTHER): Admitting: Family

## 2023-05-24 VITALS — BP 120/78 | HR 63 | Temp 98.4°F | Resp 16 | Ht 66.0 in | Wt 226.2 lb

## 2023-05-24 DIAGNOSIS — E282 Polycystic ovarian syndrome: Secondary | ICD-10-CM

## 2023-05-24 LAB — CBC WITH DIFFERENTIAL/PLATELET
Basophils Absolute: 0 10*3/uL (ref 0.0–0.1)
Basophils Relative: 0.8 % (ref 0.0–3.0)
Eosinophils Absolute: 0.1 10*3/uL (ref 0.0–0.7)
Eosinophils Relative: 1.5 % (ref 0.0–5.0)
HCT: 40.2 % (ref 36.0–46.0)
Hemoglobin: 13.3 g/dL (ref 12.0–15.0)
Lymphocytes Relative: 44.9 % (ref 12.0–46.0)
Lymphs Abs: 1.9 10*3/uL (ref 0.7–4.0)
MCHC: 33.2 g/dL (ref 30.0–36.0)
MCV: 80.9 fl (ref 78.0–100.0)
Monocytes Absolute: 0.4 10*3/uL (ref 0.1–1.0)
Monocytes Relative: 8.9 % (ref 3.0–12.0)
Neutro Abs: 1.8 10*3/uL (ref 1.4–7.7)
Neutrophils Relative %: 43.9 % (ref 43.0–77.0)
Platelets: 231 10*3/uL (ref 150.0–400.0)
RBC: 4.97 Mil/uL (ref 3.87–5.11)
RDW: 12.9 % (ref 11.5–15.5)
WBC: 4.2 10*3/uL (ref 4.0–10.5)

## 2023-05-24 LAB — COMPREHENSIVE METABOLIC PANEL WITH GFR
ALT: 23 U/L (ref 0–35)
AST: 20 U/L (ref 0–37)
Albumin: 4.2 g/dL (ref 3.5–5.2)
Alkaline Phosphatase: 75 U/L (ref 39–117)
BUN: 10 mg/dL (ref 6–23)
CO2: 29 meq/L (ref 19–32)
Calcium: 9.4 mg/dL (ref 8.4–10.5)
Chloride: 103 meq/L (ref 96–112)
Creatinine, Ser: 0.76 mg/dL (ref 0.40–1.20)
GFR: 106.8 mL/min (ref >60.00)
Glucose, Bld: 87 mg/dL (ref 70–99)
Potassium: 4.5 meq/L (ref 3.5–5.1)
Sodium: 139 meq/L (ref 135–145)
Total Bilirubin: 0.5 mg/dL (ref 0.2–1.2)
Total Protein: 7.2 g/dL (ref 6.0–8.3)

## 2023-05-24 LAB — FOLLICLE STIMULATING HORMONE: FSH: 5.7 m[IU]/mL

## 2023-05-24 LAB — LUTEINIZING HORMONE: LH: 6.32 m[IU]/mL

## 2023-05-24 LAB — TSH: TSH: 1.4 u[IU]/mL (ref 0.35–5.50)

## 2023-05-24 LAB — PROLACTIN: Prolactin: 10.6 ng/mL

## 2023-05-24 LAB — HEMOGLOBIN A1C: Hgb A1c MFr Bld: 4.9 % (ref 4.6–6.5)

## 2023-05-24 MED ORDER — PRENATAL 27-1 MG PO TABS: 1.0000 | ORAL_TABLET | Freq: Every day | ORAL | 3 refills | Status: DC

## 2023-05-24 NOTE — Progress Notes (Signed)
 Amanda Harper is a 28 y.o. female with the following history as recorded in EpicCare:  Patient Active Problem List   Diagnosis Date Noted   Alpha thalassemia silent carrier 07/25/2022   Polycystic disease, ovaries 03/25/2012    Current Outpatient Medications  Medication Sig Dispense Refill   omeprazole (PRILOSEC) 40 MG capsule Take 40 mg by mouth daily.     Prenatal 27-1 MG TABS Take 1 tablet by mouth daily. 90 tablet 3   No current facility-administered medications for this visit.    Allergies: Pork-derived products and Other  Past Medical History:  Diagnosis Date   Acne    GERD (gastroesophageal reflux disease)    Helicobacter pylori gastritis 07/11/2011   Noise-induced hearing loss of both ears 11/15/2015   Osgood-Schlatter's disease of left knee    as a child   Patella alta 11/05/2017   as a child   PCOS (polycystic ovarian syndrome)     Past Surgical History:  Procedure Laterality Date   NO PAST SURGERIES      Family History  Problem Relation Age of Onset   Arthritis Mother    Arthritis Father    Heart disease Father    Hypertension Father    Hypertension Sister    Hypertension Maternal Grandmother    Hypertension Maternal Grandfather    Diabetes Maternal Grandfather    Hyperlipidemia Paternal Grandmother    Cancer Neg Hx    Alcohol abuse Neg Hx    Drug abuse Neg Hx    Early death Neg Hx    Kidney disease Neg Hx     Social History   Tobacco Use   Smoking status: Former    Types: E-cigarettes    Quit date: 2023    Years since quitting: 2.2   Smokeless tobacco: Never  Substance Use Topics   Alcohol use: No    Subjective:   Presents today to re-establish care; was last seen by me in 2019 and had to transfer to a different provider due to insurance issues; Is 4 months post-partum; is currently breast-feeding; is interested in starting Metformin to manage PCOS symptoms; she does have upcoming appointment with GYN to discuss this as well;   Objective:   Vitals:   05/24/23 0953  BP: 120/78  Pulse: 63  Resp: 16  Temp: 98.4 F (36.9 C)  TempSrc: Oral  SpO2: 98%  Weight: 226 lb 4 oz (102.6 kg)  Height: 5\' 6"  (1.676 m)    General: Well developed, well nourished, in no acute distress  Skin : Warm and dry.  Head: Normocephalic and atraumatic  Lungs: Respirations unlabored; clear to auscultation bilaterally without wheeze, rales, rhonchi  CVS exam: normal rate and regular rhythm.  Neurologic: Alert and oriented; speech intact; face symmetrical; moves all extremities well; CNII-XII intact without focal deficit   Assessment:  1. PCOS (polycystic ovarian syndrome)   2. Mother currently breast-feeding     Plan:  Patient is already scheduled to see her GYN in 4 days- will update her labs today so that she can discuss with her GYN at upcoming visit; Patient notes she has had regular periods since having her child and that since her GYN wanted to talk to her before starting Metformin, I would prefer for her to keep this planned appointment.  Refill updated on prenatal vitamins as requested;   No follow-ups on file.  Orders Placed This Encounter  Procedures   CBC with Differential/Platelet   Comp Met (CMET)   Hemoglobin A1c  TSH   FSH   LH   Prolactin    Requested Prescriptions   Signed Prescriptions Disp Refills   Prenatal 27-1 MG TABS 90 tablet 3    Sig: Take 1 tablet by mouth daily.

## 2023-05-25 ENCOUNTER — Encounter: Payer: Self-pay | Admitting: Family

## 2023-05-27 NOTE — Progress Notes (Unsigned)
 GYNECOLOGY  VISIT   HPI: Amanda Harper is a 28 y.o.   Married female   [id number] here for PCOS management.     Patient reports dx of PCOS at age 54; took OCPs from then until one year before most recent pregnancy with regular menses and 30 pound weight gain in interim. Does not recall whether periods were irregular at time of dx. She reports regular menses since delivery in 2024, having acne and excessive facial hair. Currently breastfeeding. She would prefer not to take OCPs and requests metformin for regulation of hormones and weight loss.   Last pap smear:  Diagnosis  Date Value Ref Range Status  12/19/2022   Final   - Negative for intraepithelial lesion or malignancy (NILM)           OB History     Gravida  2   Para  1   Term  1   Preterm      AB  1   Living  1      SAB  1   IAB      Ectopic      Multiple  0   Live Births  1              Patient Active Problem List   Diagnosis Date Noted  . Alpha thalassemia silent carrier 07/25/2022  . Polycystic disease, ovaries 03/25/2012    Past Medical History:  Diagnosis Date  . Acne   . GERD (gastroesophageal reflux disease)   . Helicobacter pylori gastritis 07/11/2011  . Noise-induced hearing loss of both ears 11/15/2015  . Osgood-Schlatter's disease of left knee    as a child  . Patella alta 11/05/2017   as a child  . PCOS (polycystic ovarian syndrome)     Past Surgical History:  Procedure Laterality Date  . NO PAST SURGERIES      Current Outpatient Medications  Medication Sig Dispense Refill  . omeprazole (PRILOSEC) 40 MG capsule Take 40 mg by mouth daily.    . Prenatal 27-1 MG TABS Take 1 tablet by mouth daily. 90 tablet 3   No current facility-administered medications for this visit.     ALLERGIES: Pork-derived products and Other  Family History  Problem Relation Age of Onset  . Arthritis Mother   . Arthritis Father   . Heart disease Father   . Hypertension Father   . Hypertension  Sister   . Hypertension Maternal Grandmother   . Hypertension Maternal Grandfather   . Diabetes Maternal Grandfather   . Hyperlipidemia Paternal Grandmother   . Cancer Neg Hx   . Alcohol abuse Neg Hx   . Drug abuse Neg Hx   . Early death Neg Hx   . Kidney disease Neg Hx     Social History   Socioeconomic History  . Marital status: Married    Spouse name: Not on file  . Number of children: Not on file  . Years of education: Not on file  . Highest education level: Not on file  Occupational History  . Not on file  Tobacco Use  . Smoking status: Former    Types: E-cigarettes    Quit date: 2023    Years since quitting: 2.2  . Smokeless tobacco: Never  Vaping Use  . Vaping status: Never Used  Substance and Sexual Activity  . Alcohol use: No  . Drug use: No  . Sexual activity: Yes    Birth control/protection: None  Other Topics Concern  .  Not on file  Social History Narrative  . Not on file   Social Drivers of Health   Financial Resource Strain: Not on file  Food Insecurity: No Food Insecurity (01/15/2023)   Hunger Vital Sign   . Worried About Programme researcher, broadcasting/film/video in the Last Year: Never true   . Ran Out of Food in the Last Year: Never true  Transportation Needs: No Transportation Needs (01/15/2023)   PRAPARE - Transportation   . Lack of Transportation (Medical): No   . Lack of Transportation (Non-Medical): No  Physical Activity: Not on file  Stress: Not on file  Social Connections: Not on file  Intimate Partner Violence: Not At Risk (01/15/2023)   Humiliation, Afraid, Rape, and Kick questionnaire   . Fear of Current or Ex-Partner: No   . Emotionally Abused: No   . Physically Abused: No   . Sexually Abused: No    Review of Systems  PHYSICAL EXAMINATION:    LMP 05/23/2023 (Exact Date)     General appearance: +Large body habitus. Alert, cooperative and appears stated age Head: Normocephalic, without obvious abnormality, atraumatic Neck: no adenopathy, supple,  symmetrical, trachea midline and thyroid normal to inspection and palpation Lungs: clear to auscultation bilaterally Heart: regular rate and rhythm Extremities: extremities normal, atraumatic, no cyanosis or edema Skin: +Facial acne. Skin color, texture, turgor normal. No rashes or lesions Lymph nodes: Cervical, supraclavicular, and axillary nodes normal. No abnormal inguinal nodes palpated Neurologic: Grossly normal  Chaperone was present for exam  ASSESSMENT & PLAN  1. History of PCOS (Primary) Follow-up in 8 weeks to evaluate menstrual status, medication-related side effects, weight loss and BP.  - US  PELVIC COMPLETE WITH TRANSVAGINAL; Future      An After Visit Summary was printed and given to the patient.   Uvaldo Rybacki E Jasson Siegmann, PA-C 4/13/20258:19 PM

## 2023-05-28 ENCOUNTER — Other Ambulatory Visit: Payer: Self-pay

## 2023-05-28 ENCOUNTER — Ambulatory Visit: Admitting: Physician Assistant

## 2023-05-28 VITALS — BP 112/75 | HR 110 | Ht 65.0 in | Wt 227.6 lb

## 2023-05-28 DIAGNOSIS — Z8742 Personal history of other diseases of the female genital tract: Secondary | ICD-10-CM

## 2023-05-28 NOTE — Patient Instructions (Signed)
 For nutrition guidance, visit: [email or web address]

## 2023-05-29 MED ORDER — METFORMIN HCL 1000 MG PO TABS: ORAL_TABLET | ORAL | 2 refills | Status: DC

## 2023-05-30 ENCOUNTER — Encounter: Payer: Self-pay | Admitting: Physician Assistant

## 2023-05-31 ENCOUNTER — Ambulatory Visit (HOSPITAL_BASED_OUTPATIENT_CLINIC_OR_DEPARTMENT_OTHER)
Admission: RE | Admit: 2023-05-31 | Discharge: 2023-05-31 | Disposition: A | Discharge status: Home / Self Care | Source: Ambulatory Visit | Attending: Physician Assistant | Admitting: Physician Assistant

## 2023-05-31 DIAGNOSIS — Z8742 Personal history of other diseases of the female genital tract: Secondary | ICD-10-CM | POA: Diagnosis present

## 2023-06-17 ENCOUNTER — Encounter: Payer: Self-pay | Admitting: Physician Assistant

## 2023-06-25 ENCOUNTER — Telehealth: Payer: Self-pay | Admitting: Family Medicine

## 2023-06-25 NOTE — Telephone Encounter (Signed)
 Called patient to schedule PCOS follow up and patient says that she does not need to schedule this appointment right now, she will call when needed.

## 2023-07-05 ENCOUNTER — Telehealth: Admitting: Physician Assistant

## 2023-07-05 DIAGNOSIS — J039 Acute tonsillitis, unspecified: Secondary | ICD-10-CM

## 2023-07-05 DIAGNOSIS — J029 Acute pharyngitis, unspecified: Secondary | ICD-10-CM | POA: Diagnosis not present

## 2023-07-05 MED ORDER — AMOXICILLIN 500 MG PO CAPS: 500.0000 mg | ORAL_CAPSULE | Freq: Two times a day (BID) | ORAL | 0 refills | Status: AC

## 2023-07-05 NOTE — Patient Instructions (Addendum)
  [name], thank you for joining Wanita Gutta, PA-C for today's virtual visit.  While this provider is not your primary care provider (PCP), if your PCP is located in our provider database this encounter information will be shared with them immediately following your visit.   A [facility] MyChart account gives you access to today's visit and all your visits, tests, and labs performed at [facility] " click here if you don't have a [facility] MyChart account or go to [email or web address]  Consent: (Patient) Amanda Harper provided verbal consent for this virtual visit at the beginning of the encounter.  Current Medications:  Current Outpatient Medications:    amoxicillin  (AMOXIL ) 500 MG capsule, Take 1 capsule (500 mg total) by mouth 2 (two) times daily for 7 days., Disp: 14 capsule, Rfl: 0   metFORMIN  (GLUCOPHAGE ) 1000 MG tablet, Take 1/2 tablet by mouth daily for one week. Then increase to one tablet daily for one week.  Then one tablet twice a day. Take with meals., Disp: 60 tablet, Rfl: 2   omeprazole  (PRILOSEC) 40 MG capsule, Take 40 mg by mouth daily., Disp: , Rfl:    Prenatal 27-1 MG TABS, Take 1 tablet by mouth daily., Disp: 90 tablet, Rfl: 3   Medications ordered in this encounter:  Meds ordered this encounter  Medications   amoxicillin  (AMOXIL ) 500 MG capsule    Sig: Take 1 capsule (500 mg total) by mouth 2 (two) times daily for 7 days.    Dispense:  14 capsule    Refill:  0    Supervising Provider:   Corine Dice [[id number]]     *If you need refills on other medications prior to your next appointment, please contact your pharmacy*  Follow-Up: Call back or seek an in-person evaluation if the symptoms worsen or if the condition fails to improve as anticipated.  [facility] [phone]  Other Instructions Stay well hydrated with clear liquids Eat soft ice cream or drink warm water with honey to soothe the throat pain. Start  medicine as prescribed. Continue to watch for worsening symptoms. Briefly discussed to get PCOT for strep if medicine doesn't help. She verbalized understanding.  Schedule a virtual appointment or follow up at an urgent care clinic if symptoms don't improve.    If you have been instructed to have an in-person evaluation today at a local Urgent Care facility, please use the link below. It will take you to a list of all of our available [facility], including address, phone number and hours of operation. Please do not delay care.  [facility]  If you or a family member do not have a primary care provider, use the link below to schedule a visit and establish care. When you choose a [facility] primary care physician or advanced practice provider, you gain a long-term partner in health. Find a Primary Care Provider  Learn more about [facility] in-office and virtual care options: [facility] - Get Care Now

## 2023-07-05 NOTE — Progress Notes (Signed)
 Virtual Visit Consent   Amanda Harper, you are scheduled for a virtual visit with a [facility] provider today. Just as with appointments in the office, your consent must be obtained to participate. Your consent will be active for this visit and any virtual visit you may have with one of our providers in the next 365 days. If you have a MyChart account, a copy of this consent can be sent to you electronically.  As this is a virtual visit, video technology does not allow for your provider to perform a traditional examination. This may limit your provider's ability to fully assess your condition. If your provider identifies any concerns that need to be evaluated in person or the need to arrange testing (such as labs, EKG, etc.), we will make arrangements to do so. Although advances in technology are sophisticated, we cannot ensure that it will always work on either your end or our end. If the connection with a video visit is poor, the visit may have to be switched to a telephone visit. With either a video or telephone visit, we are not always able to ensure that we have a secure connection.  By engaging in this virtual visit, you consent to the provision of healthcare and authorize for your insurance to be billed (if applicable) for the services provided during this visit. Depending on your insurance coverage, you may receive a charge related to this service.  I need to obtain your verbal consent now. Are you willing to proceed with your visit today? Amanda Harper has provided verbal consent on 07/05/2023 for a virtual visit (video or telephone). Wanita Gutta, PA-C  Date: 07/05/2023 4:52 PM   Virtual Visit via Video Note   I, Wanita Gutta, connected with  Amanda Harper  ([id number], [birth year]) on 07/05/23 at  4:45 PM EDT by a video-enabled telemedicine application and verified that I am speaking with the correct person using two identifiers.  Location: Patient: Virtual Visit Location Patient:  Home Provider: Virtual Visit Location Provider: Home Office   I discussed the limitations of evaluation and management by telemedicine and the availability of in person appointments. The patient expressed understanding and agreed to proceed.    History of Present Illness: Amanda Harper is a 28 y.o. who identifies as a female who was assigned female at birth, and is being seen today for sore throat.  HPI: 28y/o breastfeeding F presents to video telehealth visit for c/o  sore throat, fever(temp. Measured by spouse, but pt doesn't know how high), body aches since yesterday. She noticed "white bumps" on the back of the throat. Denies known exposure to strep. Has been taking Advil  for her symptoms.     Problems:  Patient Active Problem List   Diagnosis Date Noted   Alpha thalassemia silent carrier 07/25/2022   Polycystic disease, ovaries 03/25/2012    Allergies:  Allergies  Allergen Reactions   Pork-Derived Products     religious   Other Rash    Henna dye   Medications:  Current Outpatient Medications:    amoxicillin  (AMOXIL ) 500 MG capsule, Take 1 capsule (500 mg total) by mouth 2 (two) times daily for 7 days., Disp: 14 capsule, Rfl: 0   metFORMIN  (GLUCOPHAGE ) 1000 MG tablet, Take 1/2 tablet by mouth daily for one week. Then increase to one tablet daily for one week.  Then one tablet twice a day. Take with meals., Disp: 60 tablet, Rfl: 2   omeprazole  (PRILOSEC) 40 MG capsule, Take 40 mg  by mouth daily., Disp: , Rfl:    Prenatal 27-1 MG TABS, Take 1 tablet by mouth daily., Disp: 90 tablet, Rfl: 3  Observations/Objective: Patient is well-developed, well-nourished in no acute distress.  Resting comfortably  at home.  Head is normocephalic, atraumatic.  No labored breathing.  Speech is clear and coherent with logical content.  Patient is alert and oriented at baseline.    Assessment and Plan: 1. Pharyngitis, unspecified etiology (Primary) - amoxicillin  (AMOXIL ) 500 MG capsule;  Take 1 capsule (500 mg total) by mouth 2 (two) times daily for 7 days.  Dispense: 14 capsule; Refill: 0  2. Tonsillitis - amoxicillin  (AMOXIL ) 500 MG capsule; Take 1 capsule (500 mg total) by mouth 2 (two) times daily for 7 days.  Dispense: 14 capsule; Refill: 0  Stay well hydrated with clear liquids Eat soft ice cream or drink warm water with honey to soothe the throat pain. Start medicine as prescribed. Continue to watch for worsening symptoms. Briefly discussed to get PCOT for strep if medicine doesn't help. She verbalized understanding.  Schedule a virtual appointment or follow up at an urgent care clinic if symptoms don't improve.  Pt verbalized understanding and in agreement.    Follow Up Instructions: I discussed the assessment and treatment plan with the patient. The patient was provided an opportunity to ask questions and all were answered. The patient agreed with the plan and demonstrated an understanding of the instructions.  A copy of instructions were sent to the patient via MyChart unless otherwise noted below.   Patient has requested to receive PHI (AVS, Work Notes, etc) pertaining to this video visit through e-mail as they are currently without active MyChart. They have voiced understand that email is not considered secure and their health information could be viewed by someone other than the patient.   The patient was advised to call back or seek an in-person evaluation if the symptoms worsen or if the condition fails to improve as anticipated.    Lily Velasquez, PA-C

## 2023-07-11 ENCOUNTER — Telehealth: Admitting: Family Medicine

## 2023-07-11 DIAGNOSIS — H10021 Other mucopurulent conjunctivitis, right eye: Secondary | ICD-10-CM | POA: Diagnosis not present

## 2023-07-11 MED ORDER — POLYMYXIN B-TRIMETHOPRIM 10000-0.1 UNIT/ML-% OP SOLN: 1.0000 [drp] | Freq: Four times a day (QID) | OPHTHALMIC | 0 refills | Status: DC

## 2023-07-11 NOTE — Progress Notes (Signed)
 Virtual Visit Consent   [place] Z Regala, you are scheduled for a virtual visit with a [facility] provider today. Just as with appointments in the office, your consent must be obtained to participate. Your consent will be active for this visit and any virtual visit you may have with one of our providers in the next 365 days. If you have a MyChart account, a copy of this consent can be sent to you electronically.  As this is a virtual visit, video technology does not allow for your provider to perform a traditional examination. This may limit your provider's ability to fully assess your condition. If your provider identifies any concerns that need to be evaluated in person or the need to arrange testing (such as labs, EKG, etc.), we will make arrangements to do so. Although advances in technology are sophisticated, we cannot ensure that it will always work on either your end or our end. If the connection with a video visit is poor, the visit may have to be switched to a telephone visit. With either a video or telephone visit, we are not always able to ensure that we have a secure connection.  By engaging in this virtual visit, you consent to the provision of healthcare and authorize for your insurance to be billed (if applicable) for the services provided during this visit. Depending on your insurance coverage, you may receive a charge related to this service.  I need to obtain your verbal consent now. Are you willing to proceed with your visit today? Amanda Harper has provided verbal consent on 07/11/2023 for a virtual visit (video or telephone). Lanetta Pion, NP  Date: 07/11/2023 2:38 PM   Virtual Visit via Video Note   I, Lanetta Pion, connected with  Amanda Harper  ([id number], 1996-01-14) on 07/11/23 at  2:45 PM EDT by a video-enabled telemedicine application and verified that I am speaking with the correct person using two identifiers.  Location: Patient: Virtual Visit Location Patient:  Home Provider: Virtual Visit Location Provider: Home Office   I discussed the limitations of evaluation and management by telemedicine and the availability of in person appointments. The patient expressed understanding and agreed to proceed.    History of Present Illness: Amanda Harper is a 28 y.o. who identifies as a female who was assigned female at birth, and is being seen today for pink eye of right   Onset was overnight with crusting and drainage Associated symptoms are continues to have watery and crusting if eye is closed, redness of eye.  Modifying factors are avoid rubbing it, cleaning  Denies chest pain, shortness of breath, fevers, chills, swelling or the eye or around it, redness or signs preseptal cellulitis, vision changes or  pain with eye movement   Exposure to sick contacts- unknown  Problems:  Patient Active Problem List   Diagnosis Date Noted   Alpha thalassemia silent carrier 07/25/2022   Polycystic disease, ovaries 03/25/2012    Allergies:  Allergies  Allergen Reactions   Pork-Derived Products     religious   Other Rash    Henna dye   Medications:  Current Outpatient Medications:    amoxicillin  (AMOXIL ) 500 MG capsule, Take 1 capsule (500 mg total) by mouth 2 (two) times daily for 7 days., Disp: 14 capsule, Rfl: 0   metFORMIN  (GLUCOPHAGE ) 1000 MG tablet, Take 1/2 tablet by mouth daily for one week. Then increase to one tablet daily for one week.  Then one tablet twice a  day. Take with meals., Disp: 60 tablet, Rfl: 2   omeprazole  (PRILOSEC) 40 MG capsule, Take 40 mg by mouth daily., Disp: , Rfl:    Prenatal 27-1 MG TABS, Take 1 tablet by mouth daily., Disp: 90 tablet, Rfl: 3  Observations/Objective: Patient is well-developed, well-nourished in no acute distress.  Resting comfortably  at home.  Head is normocephalic, atraumatic.  No labored breathing.  Speech is clear and coherent with logical content.  Patient is alert and oriented at baseline.  Right  eye is red   Assessment and Plan:   1. Pink eye, right (Primary)  - trimethoprim-polymyxin b (POLYTRIM) ophthalmic solution; Place 1 drop into the right eye in the morning, at noon, in the evening, and at bedtime.  Dispense: 10 mL; Refill: 0    -Use eye medication as ordered -Advised to change pillowcase after 2-3 days of medication use to avoid reinfection -Keep eye clean and dry -Avoid rubbing -Wash hands frequently -Warm compresses used prior to eye medication -Follow up in person if not improving in next 24-48 hours of starting medication, if swelling occurs, or develop vision changes or worsening blurry vision. -Tylenol  for any discomfort   Reviewed side effects, risks and benefits of medication.    Patient acknowledged agreement and understanding of the plan.   Past Medical, Surgical, Social History, Allergies, and Medications have been Reviewed.     Follow Up Instructions: I discussed the assessment and treatment plan with the patient. The patient was provided an opportunity to ask questions and all were answered. The patient agreed with the plan and demonstrated an understanding of the instructions.  A copy of instructions were sent to the patient via MyChart unless otherwise noted below.    The patient was advised to call back or seek an in-person evaluation if the symptoms worsen or if the condition fails to improve as anticipated.    Lanetta Pion, NP

## 2023-07-11 NOTE — Patient Instructions (Signed)
  Amanda Harper, thank you for joining Amanda Pion, NP for today's virtual visit.  While this provider is not your primary care provider (PCP), if your PCP is located in our provider database this encounter information will be shared with them immediately following your visit.   A [facility] MyChart account gives you access to today's visit and all your visits, tests, and labs performed at [facility] " click here if you don't have a [facility] MyChart account or go to [email or web address]  Consent: (Patient) Amanda Harper provided verbal consent for this virtual visit at the beginning of the encounter.  Current Medications:  Current Outpatient Medications:    trimethoprim-polymyxin b (POLYTRIM) ophthalmic solution, Place 1 drop into the right eye in the morning, at noon, in the evening, and at bedtime., Disp: 10 mL, Rfl: 0   amoxicillin  (AMOXIL ) 500 MG capsule, Take 1 capsule (500 mg total) by mouth 2 (two) times daily for 7 days., Disp: 14 capsule, Rfl: 0   metFORMIN  (GLUCOPHAGE ) 1000 MG tablet, Take 1/2 tablet by mouth daily for one week. Then increase to one tablet daily for one week.  Then one tablet twice a day. Take with meals., Disp: 60 tablet, Rfl: 2   omeprazole  (PRILOSEC) 40 MG capsule, Take 40 mg by mouth daily., Disp: , Rfl:    Prenatal 27-1 MG TABS, Take 1 tablet by mouth daily., Disp: 90 tablet, Rfl: 3   Medications ordered in this encounter:  Meds ordered this encounter  Medications   trimethoprim-polymyxin b (POLYTRIM) ophthalmic solution    Sig: Place 1 drop into the right eye in the morning, at noon, in the evening, and at bedtime.    Dispense:  10 mL    Refill:  0    Supervising Provider:   Corine Harper [[id number]]     *If you need refills on other medications prior to your next appointment, please contact your pharmacy*  Follow-Up: Call back or seek an in-person evaluation if the symptoms worsen or if the condition fails to improve as  anticipated.  [facility] [phone]  Other Instructions   -Use eye medication as ordered -Advised to change pillowcase after 2-3 days of medication use to avoid reinfection -Keep eye clean and dry -Avoid rubbing -Wash hands frequently -Warm compresses used prior to eye medication -Follow up in person if not improving in next 24-48 hours of starting medication, if swelling occurs, or develop vision changes or worsening blurry vision. -Tylenol  for any discomfort  If you have been instructed to have an in-person evaluation today at a local Urgent Care facility, please use the link below. It will take you to a list of all of our available [facility], including address, phone number and hours of operation. Please do not delay care.  [facility]  If you or a family member do not have a primary care provider, use the link below to schedule a visit and establish care. When you choose a [facility] primary care physician or advanced practice provider, you gain a long-term partner in health. Find a Primary Care Provider  Learn more about [facility]'s in-office and virtual care options: [facility] - Get Care Now

## 2023-09-07 ENCOUNTER — Other Ambulatory Visit: Payer: Self-pay | Admitting: Obstetrics and Gynecology

## 2023-10-11 ENCOUNTER — Encounter: Payer: Self-pay | Admitting: Family

## 2023-10-11 ENCOUNTER — Ambulatory Visit (HOSPITAL_BASED_OUTPATIENT_CLINIC_OR_DEPARTMENT_OTHER)
Admission: RE | Admit: 2023-10-11 | Discharge: 2023-10-11 | Disposition: A | Discharge status: Home / Self Care | Source: Ambulatory Visit | Attending: Family | Admitting: Family

## 2023-10-11 ENCOUNTER — Ambulatory Visit (INDEPENDENT_AMBULATORY_CARE_PROVIDER_SITE_OTHER): Admitting: Family

## 2023-10-11 VITALS — BP 118/76 | HR 70 | Ht 65.0 in | Wt 222.6 lb

## 2023-10-11 DIAGNOSIS — M25551 Pain in right hip: Secondary | ICD-10-CM

## 2023-10-11 DIAGNOSIS — Z8742 Personal history of other diseases of the female genital tract: Secondary | ICD-10-CM | POA: Diagnosis not present

## 2023-10-11 MED ORDER — PRENATAL 27-1 MG PO TABS: 1.0000 | ORAL_TABLET | Freq: Every day | ORAL | 0 refills | Status: DC

## 2023-10-11 MED ORDER — MELOXICAM 15 MG PO TABS: 15.0000 mg | ORAL_TABLET | Freq: Every day | ORAL | 0 refills | Status: DC

## 2023-10-11 MED ORDER — METFORMIN HCL 1000 MG PO TABS: ORAL_TABLET | ORAL | 1 refills | Status: DC

## 2023-10-11 NOTE — Progress Notes (Signed)
 Amanda Harper is a 28 y.o. female with the following history as recorded in [employer]:  Patient Active Problem List   Diagnosis Date Noted   Alpha thalassemia silent carrier 07/25/2022   Polycystic disease, ovaries 03/25/2012    Current Outpatient Medications  Medication Sig Dispense Refill   meloxicam  (MOBIC ) 15 MG tablet Take 1 tablet (15 mg total) by mouth daily. 30 tablet 0   metFORMIN  (GLUCOPHAGE ) 1000 MG tablet Take 1/2 tablet by mouth daily for one week. Then increase to one tablet daily for one week.  Then one tablet twice a day. Take with meals. 60 tablet 1   omeprazole  (PRILOSEC) 40 MG capsule Take 40 mg by mouth daily.     Prenatal 27-1 MG TABS Take 1 tablet by mouth daily. 90 tablet 0   trimethoprim -polymyxin b  (POLYTRIM ) ophthalmic solution Place 1 drop into the right eye in the morning, at noon, in the evening, and at bedtime. (Patient not taking: Reported on 10/11/2023) 10 mL 0   No current facility-administered medications for this visit.    Allergies: Pork-derived products and Other  Past Medical History:  Diagnosis Date   Acne    GERD (gastroesophageal reflux disease)    Helicobacter pylori gastritis 07/11/2011   Noise-induced hearing loss of both ears 11/15/2015   Osgood-Schlatter's disease of left knee    as a child   Patella alta 11/05/2017   as a child   PCOS (polycystic ovarian syndrome)     Past Surgical History:  Procedure Laterality Date   NO PAST SURGERIES      Family History  Problem Relation Age of Onset   Arthritis Mother    Arthritis Father    Heart disease Father    Hypertension Father    Hypertension Sister    Hypertension Maternal Grandmother    Hypertension Maternal Grandfather    Diabetes Maternal Grandfather    Hyperlipidemia Paternal Grandmother    Cancer Neg Hx    Alcohol abuse Neg Hx    Drug abuse Neg Hx    Early death Neg Hx    Kidney disease Neg Hx     Social History   Tobacco Use   Smoking status: Former    Types:  E-cigarettes    Quit date: 2023    Years since quitting: 2.6   Smokeless tobacco: Never  Substance Use Topics   Alcohol use: No    Subjective:   Right sided hip pain x 1 month- started after cleaning; no numbness or tingling; just painful to lay on it. Hurts to walk up stairs; not using any Advil  or Tylenol ;     Objective:  Vitals:   10/11/23 1037  BP: 118/76  Pulse: 70  SpO2: 98%  Weight: 222 lb 9.6 oz (101 kg)  Height: 5' 5 (1.651 m)    General: Well developed, well nourished, in no acute distress  Skin : Warm and dry.  Head: Normocephalic and atraumatic  Lungs: Respirations unlabored;  Musculoskeletal: No deformities; no active joint inflammation  Extremities: No edema, cyanosis, clubbing  Vessels: Symmetric bilaterally  Neurologic: Alert and oriented; speech intact; face symmetrical; moves all extremities well; CNII-XII intact without focal deficit   Assessment:  1. Right hip pain   2. History of PCOS   3. Breast feeding status of mother     Plan:  Update lumbar X-ray and right hip X-ray; trial of Mobic  15 mg daily; to consider PT as well; Verified that patient is not breast-feeding but hopes to start  trying for 2nd baby in the next 1-2 months; refills updated for prenatal vitamins and Metformin ;   No follow-ups on file.  Orders Placed This Encounter  Procedures   DG Lumbar Spine Complete    Standing Status:   Future    Number of Occurrences:   1    Expiration Date:   10/10/2024    Reason for Exam (SYMPTOM  OR DIAGNOSIS REQUIRED):   low back pain    Is patient pregnant?:   No    Preferred imaging location?:   [facility]   DG Hip Unilat W OR W/O Pelvis 1V Right    Standing Status:   Future    Number of Occurrences:   1    Expiration Date:   10/10/2024    Reason for Exam (SYMPTOM  OR DIAGNOSIS REQUIRED):   right hip pain    Is patient pregnant?:   No    Preferred imaging location?:   [facility]    Requested Prescriptions   Signed  Prescriptions Disp Refills   metFORMIN  (GLUCOPHAGE ) 1000 MG tablet 60 tablet 1    Sig: Take 1/2 tablet by mouth daily for one week. Then increase to one tablet daily for one week.  Then one tablet twice a day. Take with meals.   Prenatal 27-1 MG TABS 90 tablet 0    Sig: Take 1 tablet by mouth daily.   meloxicam  (MOBIC ) 15 MG tablet 30 tablet 0    Sig: Take 1 tablet (15 mg total) by mouth daily.

## 2023-10-22 ENCOUNTER — Ambulatory Visit: Payer: Self-pay | Admitting: Family

## 2023-10-23 ENCOUNTER — Other Ambulatory Visit: Payer: Self-pay | Admitting: Family

## 2023-10-23 DIAGNOSIS — M549 Dorsalgia, unspecified: Secondary | ICD-10-CM

## 2023-11-21 ENCOUNTER — Encounter: Payer: Self-pay | Admitting: Physical Medicine and Rehabilitation

## 2023-11-21 ENCOUNTER — Ambulatory Visit: Admitting: Physical Medicine and Rehabilitation

## 2023-11-21 DIAGNOSIS — G8929 Other chronic pain: Secondary | ICD-10-CM | POA: Diagnosis not present

## 2023-11-21 DIAGNOSIS — M5441 Lumbago with sciatica, right side: Secondary | ICD-10-CM

## 2023-11-21 DIAGNOSIS — M25551 Pain in right hip: Secondary | ICD-10-CM

## 2023-11-21 NOTE — Progress Notes (Signed)
 Pain Scale   Average Pain 1 Patient advising she has chronic lower back to right hip pain that increase when working        +Driver, -BT, -Dye Allergies.

## 2023-11-21 NOTE — Progress Notes (Signed)
 Amanda Harper - 28 y.o. female MRN [id number]  Date of birth: 1995/06/02  Office Visit Note: Visit Date: 11/21/2023 PCP: Jason Leita Repine, FNP (Inactive) Referred by: Jason Leita Repine,*  Subjective: Chief Complaint  Patient presents with   Lower Back - Pain   HPI: Amanda Harper is a 28 y.o. female who comes in today per the request of Leita Repine Jason, NP for evaluation of chronic bilateral lower back pain radiating to right lateral hip. Pain ongoing since August. No recent falls or injuries. Her pain is now intermittent and has improved significantly over the last several weeks. Her pain worsens with movement and activity. She describes pain as throbbing sensation, currently rates as 2 out of 10. Some relief of pain with home exercise regimen, rest and use of medications. She was prescribed Meloxicam  by her PCP was reluctant to take this medication in fear that it would raise her blood pressure. No history of formal physical therapy. Recent lumbar radiographs shows mild anterior wedging of the T12 vertebral body with about 15% loss of anterior height, without retropulsion. Age of finding is indeterminate. Good joint spacing to right hip, no evidence of osteoarthritis. No history of lumbar MRI imaging. No history of lumbar surgery/injections. Patient denies focal weakness. No recent trauma.      Review of Systems  Musculoskeletal:  Positive for back pain.  Neurological:  Negative for tingling, sensory change, focal weakness and weakness.  All other systems reviewed and are negative.  Otherwise per HPI.  Assessment & Plan: Visit Diagnoses:    ICD-10-CM   1. Chronic bilateral low back pain with right-sided sciatica  G89.29    M54.41     2. Pain in right hip  M25.551        Plan: Findings:  Chronic bilateral lower back pain radiating to right lateral hip. Her pain has significantly improved over the last several weeks. Patients clinical presentation and exam are  complex, differentials include lumbar radiculopathy vs lumbar myofascial strain. I discussed recent lumbar radiographs including age indeterminate compression deformity of T12 vertebral body. I explained to her that our next option would be to obtain lumbar MRI imaging. She would like to hold on lumbar MRI imaging at this time. I also discussed medication management, she prefers to also hold on medications. I did place order for short course of formal physical therapy with a focus on manual treatments and core strengthening. We are happy to see patient back as needed. I also instructed her to let us  know if she would like to move forward with lumbar MRI imaging. No red flag symptoms noted upon exam today.     Meds & Orders: No orders of the defined types were placed in this encounter.  No orders of the defined types were placed in this encounter.   Follow-up: Return if symptoms worsen or fail to improve.   Procedures: No procedures performed      Clinical History: No specialty comments available.   She reports that she quit smoking about 2 years ago. Her smoking use included e-cigarettes. She has never used smokeless tobacco.  Recent Labs    05/24/23 1044  HGBA1C 4.9    Objective:  VS:  HT:    WT:   BMI:     BP:   HR: bpm  TEMP: ( )  RESP:  Physical Exam Vitals and nursing note reviewed.  HENT:     Head: Normocephalic and atraumatic.     Right Ear: External  ear normal.     Left Ear: External ear normal.     Nose: Nose normal.     Mouth/Throat:     Mouth: Mucous membranes are moist.  Eyes:     Extraocular Movements: Extraocular movements intact.  Cardiovascular:     Rate and Rhythm: Normal rate.     Pulses: Normal pulses.  Pulmonary:     Effort: Pulmonary effort is normal.  Abdominal:     General: Abdomen is flat. There is no distension.  Musculoskeletal:        General: Tenderness present.     Cervical back: Normal range of motion.     Comments: Patient rises from  seated position to standing without difficulty. Good lumbar range of motion. No pain noted with facet loading. 5/5 strength noted with bilateral hip flexion, knee flexion/extension, ankle dorsiflexion/plantarflexion and EHL. No clonus noted bilaterally. No pain upon palpation of greater trochanters. No pain with internal/external rotation of bilateral hips. Sensation intact bilaterally. Negative slump test bilaterally. Ambulates without aid, gait steady.     Skin:    General: Skin is warm and dry.     Capillary Refill: Capillary refill takes less than 2 seconds.  Neurological:     General: No focal deficit present.     Mental Status: She is alert and oriented to person, place, and time.  Psychiatric:        Mood and Affect: Mood normal.        Behavior: Behavior normal.     Ortho Exam  Imaging: No results found.  Past Medical/Family/Surgical/Social History: Medications & Allergies reviewed per EMR, new medications updated. Patient Active Problem List   Diagnosis Date Noted   Alpha thalassemia silent carrier 07/25/2022   Polycystic disease, ovaries 03/25/2012   Past Medical History:  Diagnosis Date   Acne    GERD (gastroesophageal reflux disease)    Helicobacter pylori gastritis 07/11/2011   Noise-induced hearing loss of both ears 11/15/2015   Osgood-Schlatter's disease of left knee    as a child   Patella alta 11/05/2017   as a child   PCOS (polycystic ovarian syndrome)    Family History  Problem Relation Age of Onset   Arthritis Mother    Arthritis Father    Heart disease Father    Hypertension Father    Hypertension Sister    Hypertension Maternal Grandmother    Hypertension Maternal Grandfather    Diabetes Maternal Grandfather    Hyperlipidemia Paternal Grandmother    Cancer Neg Hx    Alcohol abuse Neg Hx    Drug abuse Neg Hx    Early death Neg Hx    Kidney disease Neg Hx    Past Surgical History:  Procedure Laterality Date   NO PAST SURGERIES     Social  History   Occupational History   Not on file  Tobacco Use   Smoking status: Former    Types: E-cigarettes    Quit date: 2023    Years since quitting: 2.7   Smokeless tobacco: Never  Vaping Use   Vaping status: Never Used  Substance and Sexual Activity   Alcohol use: No   Drug use: No   Sexual activity: Yes    Birth control/protection: None

## 2023-12-07 ENCOUNTER — Ambulatory Visit: Attending: Physical Medicine and Rehabilitation

## 2023-12-07 ENCOUNTER — Other Ambulatory Visit: Payer: Self-pay

## 2023-12-07 DIAGNOSIS — G8929 Other chronic pain: Secondary | ICD-10-CM | POA: Insufficient documentation

## 2023-12-07 DIAGNOSIS — M25551 Pain in right hip: Secondary | ICD-10-CM | POA: Diagnosis present

## 2023-12-07 DIAGNOSIS — M5459 Other low back pain: Secondary | ICD-10-CM | POA: Diagnosis present

## 2023-12-07 DIAGNOSIS — M5441 Lumbago with sciatica, right side: Secondary | ICD-10-CM | POA: Diagnosis not present

## 2023-12-07 NOTE — Therapy (Signed)
 OUTPATIENT PHYSICAL THERAPY THORACOLUMBAR EVALUATION   Patient Name: Amanda Harper MRN: [id number] DOB:1995/06/02, 28 y.o., female Today's Date: 12/07/2023  END OF SESSION:  PT End of Session - 12/07/23 1257     Visit Number 1    Number of Visits 8    Date for Recertification  02/01/24    PT Start Time 1257    PT Stop Time 1336    PT Time Calculation (min) 39 min    Activity Tolerance Patient tolerated treatment well    Behavior During Therapy [facility] for tasks assessed/performed          Past Medical History:  Diagnosis Date   Acne    GERD (gastroesophageal reflux disease)    Helicobacter pylori gastritis 07/11/2011   Noise-induced hearing loss of both ears 11/15/2015   Osgood-Schlatter's disease of left knee    as a child   Patella alta 11/05/2017   as a child   PCOS (polycystic ovarian syndrome)    Past Surgical History:  Procedure Laterality Date   NO PAST SURGERIES     Patient Active Problem List   Diagnosis Date Noted   Alpha thalassemia silent carrier 07/25/2022   Polycystic disease, ovaries 03/25/2012    PCP: Leita Eliza Elbe, FNP  REFERRING PROVIDER: Trudy Duwaine BRAVO, NP  REFERRING DIAG: G89.29,M54.41 (ICD-10-CM) - Chronic bilateral low back pain with right-sided sciatica M25.551 (ICD-10-CM) - Pain in right hip  Referral notes: Eval and Treat: Chronic lower back pain, right hip pain. Focus on manual treatments, core strengthening and possible dry needling  Rationale for Evaluation and Treatment: Rehabilitation  THERAPY DIAG:  Other low back pain  Pain in right hip  ONSET DATE: August 2025  SUBJECTIVE:                                                                                                                                                                                           SUBJECTIVE STATEMENT: Pt notes that in August 2025, she was working on cleaning her house and noted onset of severe low back pain which has been overall  improving since onset. She does not recall MOI but reports that symptoms were present the next day. Pt states that her symptoms will increase with too much activity and notes that it is more time dependent vs time of day dependent but will have some inc discomfort at the end of the day. Symptoms in the RLE are intermittent in nature and range in intensity from 0/10-3/10 extending into the lower leg and radicular in nature, and low back pain is consistently a 3-5/10. Pt reports improvement in symptoms with stretching into  lumbar flexion.   PERTINENT HISTORY:  From Ortho visit: Amanda Harper is a 28 y.o. female who comes in today per the request of Leita Eliza Elbe, NP for evaluation of chronic bilateral lower back pain radiating to right lateral hip. Pain ongoing since August. No recent falls or injuries. Her pain is now intermittent and has improved significantly over the last several weeks. Her pain worsens with movement and activity. She describes pain as throbbing sensation, currently rates as 2 out of 10. Some relief of pain with home exercise regimen, rest and use of medications. She was prescribed Meloxicam  by her PCP was reluctant to take this medication in fear that it would raise her blood pressure. No history of formal physical therapy. Recent lumbar radiographs shows mild anterior wedging of the T12 vertebral body with about 15% loss of anterior height, without retropulsion. Age of finding is indeterminate. Good joint spacing to right hip, no evidence of osteoarthritis. No history of lumbar MRI imaging. No history of lumbar surgery/injections. Patient denies focal weakness. No recent trauma.   PAIN:  Are you having pain? Yes: NPRS scale: 3 Pain location: low back and R hip Pain description: dull ache Aggravating factors: prolonged or repeated flexion of low back Relieving factors: stretching   PRECAUTIONS: None  RED FLAGS: None   WEIGHT BEARING RESTRICTIONS: No  FALLS:  Has  patient fallen in last 6 months? No  LIVING ENVIRONMENT: Lives with: lives with their family Lives in: House/apartment Stairs: No Has following equipment at home: None  OCCUPATION: Financial controller, active lifting, standing, sitting, bending  PLOF: Independent  PATIENT GOALS: reduce or abolish pain  NEXT MD VISIT: not scheduled  OBJECTIVE:  Note: Objective measures were completed at Evaluation unless otherwise noted.  DIAGNOSTIC FINDINGS:  IMPRESSION: 1. Mild anterior wedging of the T12 vertebral body with about 15% loss of anterior height, without retropulsion. Age of finding is indeterminate without prior studies. A recent injury is not excluded. 2. No evidence of lumbar spine fracture. 3. Negative right hip.  PATIENT SURVEYS:  Modified Oswestry (MCID 12.8) Eval (12/07/23): 13/50  COGNITION: Overall cognitive status: Within functional limits for tasks assessed     SENSATION: WFL  MUSCLE LENGTH: Limited piriformis length on RLE, bilateral tightness in thoracolumbar paraspinals   POSTURE: rounded shoulders and forward head  PALPATION: Increased TTP along thoracolumbar paraspinals, improves as palpation moves inferiorly  LUMBAR ROM:   AROM eval  Flexion Nil loss  Extension Nil loss  Right lateral flexion Nil loss  Left lateral flexion Nil loss, inc R hip pain  Right rotation   Left rotation    (Blank rows = not tested)  LOWER EXTREMITY ROM:     Hip ROM is WNL bilaterally, with increased symptoms during active contraction into hip flexion and ABD on RLE. Symptoms abolish with same position only passive.   LOWER EXTREMITY MMT:    MMT Right eval Left eval  Hip flexion 4/5 P! 5/5  Hip extension    Hip abduction 4/5 P! 5/5  Hip adduction 5/5 5/5  Hip internal rotation    Hip external rotation    Knee flexion 5/5 5/5  Knee extension 5/5 5/5  Ankle dorsiflexion 5/5 5/5  Ankle plantarflexion 5/5 5/5  Ankle inversion    Ankle eversion     (Blank  rows = not tested)     TREATMENT DATE: 12/07/2023      [facility] Adult PT Treatment:  DATE: 12/07/23 Therapeutic Exercise: Pt completes thoracic side bending with rotation to achieve elongation to thoracolumbar paraspinals, seated fig 4 piriformis stretch, and sciatic nerve glide in supine with rationale, HEP developed, provided, and reviewed.                                                                                                                               PATIENT EDUCATION:  Education details: Pt educated on relevant anatomy, physiology, pathology, diagnosis, prognosis, progression of care, pain and activity modification related to low back pain and R hip pain Person educated: Patient Education method: Explanation, Demonstration, and Handouts Education comprehension: verbalized understanding and returned demonstration  HOME EXERCISE PROGRAM: Access Code: [email or web address] URL: [email or web address] Date: 12/07/2023 Prepared by: Stann Ohara  Exercises - Seated Figure 4 Piriformis Stretch  - 1 x daily - 7 x weekly - 1 sets - 30 reps - 2 hold - Seated Trunk Rotation Stretch  - 1 x daily - 7 x weekly - 1 sets - 30 reps - 2 hold - Supine Sciatic Nerve Glide  - 1 x daily - 7 x weekly - 3 sets - 15 reps - 2 hold  ASSESSMENT:  CLINICAL IMPRESSION: Patient is a 28 y.o. F who was seen today for physical therapy evaluation and treatment for low back pain with intermittent RLE pain. Pt symptoms are more evident today when active hip activation is required. Lumbar ROM is WNL and symptoms reproduced in R hip with left side bending. Pt has asymmetry with elongation to the piriformis muscle and is likely contributing to symptoms based on relationship with sciatic nerve. Pt has increased TTP in the thoracolumbar paraspinals from T9-L2 before beginning to dissipate. Pt provided HEP to address these deficits. Pt stands to benefit from skilled  physical therapy in order to address noted deficit areas and restore functional movement patterns and activities consistent with prior level of function.    OBJECTIVE IMPAIRMENTS: decreased activity tolerance, decreased strength, impaired perceived functional ability, increased muscle spasms, and pain.   ACTIVITY LIMITATIONS: carrying, lifting, bending, standing, and sleeping  PARTICIPATION LIMITATIONS: cleaning, laundry, driving, community activity, and occupation  PERSONAL FACTORS: Past/current experiences, Profession, and Time since onset of injury/illness/exacerbation are also affecting patient's functional outcome.   REHAB POTENTIAL: Good  CLINICAL DECISION MAKING: Stable/uncomplicated  EVALUATION COMPLEXITY: Low   GOALS: Goals reviewed with patient? Yes  SHORT TERM GOALS: Target date: 01/02/24  Pt will report compliance with HEP prescribed Baseline: provided Goal status: INITIAL  2.  Pt will report no greater than 1/10 pain in R hip x3 consecutive days or greater Baseline: 0/10-3/10 Goal status: INITIAL  3.  Pt will score no greater than 8/50 on mod ODI Baseline: 13/50  Goal status: INITIAL    LONG TERM GOALS: Target date: 02/01/24  Pt will report no greater than 1/10 pain in lumbar spine and 0/10 pain in R hip x5 consecutive days or greater Baseline: 5/10 low back pain, 3/10  RLE pain Goal status: INITIAL  2.  Pt will score no greater than 0/50 on mod ODI Baseline: 13/50 Goal status: INITIAL  3.  Pt will demonstrate full and painless ROM in RLE and lumbar spine  Baseline: pain with L side bend in lumbar spine and with R hip flexion, abd Goal status: INITIAL    PLAN:  PT FREQUENCY: 1-2x/week  PT DURATION: 8 weeks  PLANNED INTERVENTIONS: 97110-Therapeutic exercises, 97530- Therapeutic activity, [id number]- Neuromuscular re-education, 97535- Self Care, [id number]- Manual therapy, [id number]- Electrical stimulation (unattended), 20560 (1-2 muscles), 20561 (3+ muscles)-  Dry Needling, Patient/Family education, Cryotherapy, and Moist heat.  PLAN FOR NEXT SESSION: assess response to provided HEP, core strength and stability, RLE strength and stability through functional movement patterns.    Stann DELENA Ohara, PT 12/07/2023, 2:22 PM

## 2023-12-12 ENCOUNTER — Ambulatory Visit: Admitting: Physical Therapy

## 2023-12-17 ENCOUNTER — Encounter: Payer: Self-pay | Admitting: Radiology

## 2023-12-19 ENCOUNTER — Telehealth: Payer: Self-pay

## 2023-12-19 ENCOUNTER — Ambulatory Visit: Attending: Physical Medicine and Rehabilitation

## 2023-12-19 NOTE — Telephone Encounter (Signed)
 LVM regarding missed appointment. Reminded of next appointment time and clinic attendance policy.  1st no-show  Corean Pouch, [place] 12/19/23 12:39 PM

## 2023-12-19 NOTE — Therapy (Incomplete)
 OUTPATIENT PHYSICAL THERAPY TREATMENT NOTE   Patient Name: Amanda Harper MRN: [id number] DOB:November 11, 1995, 28 y.o., female Today's Date: 12/19/2023  END OF SESSION:    Past Medical History:  Diagnosis Date   Acne    GERD (gastroesophageal reflux disease)    Helicobacter pylori gastritis 07/11/2011   Noise-induced hearing loss of both ears 11/15/2015   Osgood-Schlatter's disease of left knee    as a child   Patella alta 11/05/2017   as a child   PCOS (polycystic ovarian syndrome)    Past Surgical History:  Procedure Laterality Date   NO PAST SURGERIES     Patient Active Problem List   Diagnosis Date Noted   Alpha thalassemia silent carrier 07/25/2022   Polycystic disease, ovaries 03/25/2012    PCP: Leita Eliza Elbe, FNP  REFERRING PROVIDER: Trudy Duwaine BRAVO, NP  REFERRING DIAG: G89.29,M54.41 (ICD-10-CM) - Chronic bilateral low back pain with right-sided sciatica M25.551 (ICD-10-CM) - Pain in right hip  Referral notes: Eval and Treat: Chronic lower back pain, right hip pain. Focus on manual treatments, core strengthening and possible dry needling  Rationale for Evaluation and Treatment: Rehabilitation  THERAPY DIAG:  No diagnosis found.  ONSET DATE: August 2025  SUBJECTIVE:                                                                                                                                                                                           SUBJECTIVE STATEMENT: ***  EVAL: Pt notes that in August 2025, she was working on cleaning her house and noted onset of severe low back pain which has been overall improving since onset. She does not recall MOI but reports that symptoms were present the next day. Pt states that her symptoms will increase with too much activity and notes that it is more time dependent vs time of day dependent but will have some inc discomfort at the end of the day. Symptoms in the RLE are intermittent in nature and range in  intensity from 0/10-3/10 extending into the lower leg and radicular in nature, and low back pain is consistently a 3-5/10. Pt reports improvement in symptoms with stretching into lumbar flexion.   PERTINENT HISTORY:  From Ortho visit: Amanda Harper is a 28 y.o. female who comes in today per the request of Leita Eliza Elbe, NP for evaluation of chronic bilateral lower back pain radiating to right lateral hip. Pain ongoing since August. No recent falls or injuries. Her pain is now intermittent and has improved significantly over the last several weeks. Her pain worsens with movement and activity. She describes pain as throbbing sensation, currently rates  as 2 out of 10. Some relief of pain with home exercise regimen, rest and use of medications. She was prescribed Meloxicam  by her PCP was reluctant to take this medication in fear that it would raise her blood pressure. No history of formal physical therapy. Recent lumbar radiographs shows mild anterior wedging of the T12 vertebral body with about 15% loss of anterior height, without retropulsion. Age of finding is indeterminate. Good joint spacing to right hip, no evidence of osteoarthritis. No history of lumbar MRI imaging. No history of lumbar surgery/injections. Patient denies focal weakness. No recent trauma.   PAIN:  Are you having pain? Yes: NPRS scale: 3 Pain location: low back and R hip Pain description: dull ache Aggravating factors: prolonged or repeated flexion of low back Relieving factors: stretching   PRECAUTIONS: None  RED FLAGS: None   WEIGHT BEARING RESTRICTIONS: No  FALLS:  Has patient fallen in last 6 months? No  LIVING ENVIRONMENT: Lives with: lives with their family Lives in: House/apartment Stairs: No Has following equipment at home: None  OCCUPATION: financial controller, active lifting, standing, sitting, bending  PLOF: Independent  PATIENT GOALS: reduce or abolish pain  NEXT MD VISIT: not  scheduled  OBJECTIVE:  Note: Objective measures were completed at Evaluation unless otherwise noted.  DIAGNOSTIC FINDINGS:  IMPRESSION: 1. Mild anterior wedging of the T12 vertebral body with about 15% loss of anterior height, without retropulsion. Age of finding is indeterminate without prior studies. A recent injury is not excluded. 2. No evidence of lumbar spine fracture. 3. Negative right hip.  PATIENT SURVEYS:  Modified Oswestry (MCID 12.8) Eval (12/07/23): 13/50  COGNITION: Overall cognitive status: Within functional limits for tasks assessed     SENSATION: WFL  MUSCLE LENGTH: Limited piriformis length on RLE, bilateral tightness in thoracolumbar paraspinals   POSTURE: rounded shoulders and forward head  PALPATION: Increased TTP along thoracolumbar paraspinals, improves as palpation moves inferiorly  LUMBAR ROM:   AROM eval  Flexion Nil loss  Extension Nil loss  Right lateral flexion Nil loss  Left lateral flexion Nil loss, inc R hip pain  Right rotation   Left rotation    (Blank rows = not tested)  LOWER EXTREMITY ROM:     Hip ROM is WNL bilaterally, with increased symptoms during active contraction into hip flexion and ABD on RLE. Symptoms abolish with same position only passive.   LOWER EXTREMITY MMT:    MMT Right eval Left eval  Hip flexion 4/5 P! 5/5  Hip extension    Hip abduction 4/5 P! 5/5  Hip adduction 5/5 5/5  Hip internal rotation    Hip external rotation    Knee flexion 5/5 5/5  Knee extension 5/5 5/5  Ankle dorsiflexion 5/5 5/5  Ankle plantarflexion 5/5 5/5  Ankle inversion    Ankle eversion     (Blank rows = not tested)     TREATMENT DATE:  [facility] Adult PT Treatment:                                                DATE: 12/19/23 Therapeutic Exercise: Nustep Seated hamstring stretch Bridges LTR Sidelying open books  Manual Therapy: *** Neuromuscular re-ed: Supine 90/90 hold Therapeutic Activity: Standing 3 way hip STS  arms crossed Modalities: *** Self Care: ***      [facility] Adult PT Treatment:  DATE: 12/07/23 Therapeutic Exercise: Pt completes thoracic side bending with rotation to achieve elongation to thoracolumbar paraspinals, seated fig 4 piriformis stretch, and sciatic nerve glide in supine with rationale, HEP developed, provided, and reviewed.                                                                                                                               PATIENT EDUCATION:  Education details: Pt educated on relevant anatomy, physiology, pathology, diagnosis, prognosis, progression of care, pain and activity modification related to low back pain and R hip pain Person educated: Patient Education method: Explanation, Demonstration, and Handouts Education comprehension: verbalized understanding and returned demonstration  HOME EXERCISE PROGRAM: Access Code: [email or web address] URL: [email or web address] Date: 12/07/2023 Prepared by: Stann Ohara  Exercises - Seated Figure 4 Piriformis Stretch  - 1 x daily - 7 x weekly - 1 sets - 30 reps - 2 hold - Seated Trunk Rotation Stretch  - 1 x daily - 7 x weekly - 1 sets - 30 reps - 2 hold - Supine Sciatic Nerve Glide  - 1 x daily - 7 x weekly - 3 sets - 15 reps - 2 hold  ASSESSMENT:  CLINICAL IMPRESSION: ***  EVAL: Patient is a 28 y.o. F who was seen today for physical therapy evaluation and treatment for low back pain with intermittent RLE pain. Pt symptoms are more evident today when active hip activation is required. Lumbar ROM is WNL and symptoms reproduced in R hip with left side bending. Pt has asymmetry with elongation to the piriformis muscle and is likely contributing to symptoms based on relationship with sciatic nerve. Pt has increased TTP in the thoracolumbar paraspinals from T9-L2 before beginning to dissipate. Pt provided HEP to address these deficits. Pt stands to benefit from  skilled physical therapy in order to address noted deficit areas and restore functional movement patterns and activities consistent with prior level of function.    OBJECTIVE IMPAIRMENTS: decreased activity tolerance, decreased strength, impaired perceived functional ability, increased muscle spasms, and pain.   ACTIVITY LIMITATIONS: carrying, lifting, bending, standing, and sleeping  PARTICIPATION LIMITATIONS: cleaning, laundry, driving, community activity, and occupation  PERSONAL FACTORS: Past/current experiences, Profession, and Time since onset of injury/illness/exacerbation are also affecting patient's functional outcome.   REHAB POTENTIAL: Good  CLINICAL DECISION MAKING: Stable/uncomplicated  EVALUATION COMPLEXITY: Low   GOALS: Goals reviewed with patient? Yes  SHORT TERM GOALS: Target date: 01/02/24  Pt will report compliance with HEP prescribed Baseline: provided Goal status: INITIAL  2.  Pt will report no greater than 1/10 pain in R hip x3 consecutive days or greater Baseline: 0/10-3/10 Goal status: INITIAL  3.  Pt will score no greater than 8/50 on mod ODI Baseline: 13/50  Goal status: INITIAL    LONG TERM GOALS: Target date: 02/01/24  Pt will report no greater than 1/10 pain in lumbar spine and 0/10 pain in R hip x5 consecutive days or greater Baseline: 5/10 low  back pain, 3/10 RLE pain Goal status: INITIAL  2.  Pt will score no greater than 0/50 on mod ODI Baseline: 13/50 Goal status: INITIAL  3.  Pt will demonstrate full and painless ROM in RLE and lumbar spine  Baseline: pain with L side bend in lumbar spine and with R hip flexion, abd Goal status: INITIAL    PLAN:  PT FREQUENCY: 1-2x/week  PT DURATION: 8 weeks  PLANNED INTERVENTIONS: 97110-Therapeutic exercises, 97530- Therapeutic activity, [id number]- Neuromuscular re-education, 97535- Self Care, 02859- Manual therapy, G0283- Electrical stimulation (unattended), 20560 (1-2 muscles), 20561 (3+  muscles)- Dry Needling, Patient/Family education, Cryotherapy, and Moist heat.  PLAN FOR NEXT SESSION: assess response to provided HEP, core strength and stability, RLE strength and stability through functional movement patterns.    Corean Pouch, PTA 12/19/2023, 8:12 AM

## 2023-12-26 ENCOUNTER — Ambulatory Visit

## 2024-01-02 ENCOUNTER — Ambulatory Visit

## 2024-01-18 ENCOUNTER — Other Ambulatory Visit: Payer: Self-pay | Admitting: Family Medicine

## 2024-01-18 DIAGNOSIS — K219 Gastro-esophageal reflux disease without esophagitis: Secondary | ICD-10-CM

## 2024-02-03 ENCOUNTER — Other Ambulatory Visit: Payer: Self-pay

## 2024-02-03 ENCOUNTER — Inpatient Hospital Stay (HOSPITAL_COMMUNITY)
Admission: AD | Admit: 2024-02-03 | Discharge: 2024-02-03 | Disposition: A | Discharge status: Home / Self Care | Attending: Obstetrics and Gynecology | Admitting: Obstetrics and Gynecology

## 2024-02-03 ENCOUNTER — Encounter (HOSPITAL_COMMUNITY): Payer: Self-pay | Admitting: Obstetrics and Gynecology

## 2024-02-03 DIAGNOSIS — Z3A01 Less than 8 weeks gestation of pregnancy: Secondary | ICD-10-CM

## 2024-02-03 DIAGNOSIS — O0281 Inappropriate change in quantitative human chorionic gonadotropin (hCG) in early pregnancy: Secondary | ICD-10-CM | POA: Insufficient documentation

## 2024-02-03 DIAGNOSIS — Z3201 Encounter for pregnancy test, result positive: Secondary | ICD-10-CM

## 2024-02-03 DIAGNOSIS — O280 Abnormal hematological finding on antenatal screening of mother: Secondary | ICD-10-CM

## 2024-02-03 DIAGNOSIS — R109 Unspecified abdominal pain: Secondary | ICD-10-CM | POA: Diagnosis present

## 2024-02-03 LAB — URINALYSIS, ROUTINE W REFLEX MICROSCOPIC
Bilirubin Urine: NEGATIVE
Glucose, UA: NEGATIVE mg/dL
Hgb urine dipstick: NEGATIVE
Ketones, ur: NEGATIVE mg/dL
Leukocytes,Ua: NEGATIVE
Nitrite: NEGATIVE
Protein, ur: NEGATIVE mg/dL
Specific Gravity, Urine: 1.02 (ref 1.005–1.030)
pH: 6 (ref 5.0–8.0)

## 2024-02-03 LAB — HCG, QUANTITATIVE, PREGNANCY: hCG, Beta Chain, Quant, S: 24 m[IU]/mL — ABNORMAL HIGH

## 2024-02-03 LAB — WET PREP, GENITAL
Clue Cells Wet Prep HPF POC: NONE SEEN
Sperm: NONE SEEN
Trich, Wet Prep: NONE SEEN
WBC, Wet Prep HPF POC: <10
Yeast Wet Prep HPF POC: NONE SEEN

## 2024-02-03 LAB — POCT PREGNANCY, URINE: Preg Test, Ur: NEGATIVE

## 2024-02-03 NOTE — MAU Note (Signed)
 Amanda Harper is a 28 y.o. at Unknown here in MAU reporting: +HPT Friday 02/01/24  Lower abdominal cramping and lower back pain. Denies any vaginal bleeding. Patient denies any unusual vaginal discharge, vaginal odor, itching or pain. Denies any recent sexual intercourse.    LMP: 01/03/24 Pain score: back pain 5  lower abdomen 5 Vitals:   02/03/24 1605  BP: 137/82  Pulse: 87  Resp: 18  Temp: 98 F (36.7 C)  SpO2: 100%     FHT:n/a Lab orders placed from triage: pregnancy, vag swabs

## 2024-02-03 NOTE — Discharge Instructions (Signed)
 Return to MAU: If you have heavy bleeding that soaks through more that 2 pads per hour for an hour or more If you bleed so much that you feel like you might pass out or you do pass out If you have significant abdominal pain that is not improved with Tylenol  1000 mg every 8 hours as needed for pain If you develop a fever > 100.5

## 2024-02-03 NOTE — MAU Provider Note (Signed)
 " History     CSN: [id number]  Arrival date and time: 02/03/24 1538   Event Date/Time   First Provider Initiated Contact with Patient 02/03/24 1856      Chief Complaint  Patient presents with   Possible Pregnancy   HPI [name] is a 28 y.o. year old [id number] female at [redacted]w[redacted]d weeks gestation who presents to MAU reporting positive HPT on Friday, 02/01/2024.  She complains of lower abdominal cramping and lower back pain.  She denies vaginal bleeding, vaginal discharge, vaginal odor or itching/irritation.  She denies any recent sexual intercourse.  She reports her last LMP was 01/03/2024; this was 3 days late for her normal menses.  She reports she reports that her menstrual cycle is normally regular and on time.  She normally gets her G[facility] care [facility].  OB History     Gravida  3   Para  1   Term  1   Preterm      AB  1   Living  1      SAB  1   IAB      Ectopic      Multiple  0   Live Births  1           Past Medical History:  Diagnosis Date   Acne    GERD (gastroesophageal reflux disease)    Helicobacter pylori gastritis 07/11/2011   Noise-induced hearing loss of both ears 11/15/2015   Osgood-Schlatter's disease of left knee    as a child   Patella alta 11/05/2017   as a child   PCOS (polycystic ovarian syndrome)     Past Surgical History:  Procedure Laterality Date   NO PAST SURGERIES      Family History  Problem Relation Age of Onset   Arthritis Mother    Arthritis Father    Heart disease Father    Hypertension Father    Hypertension Sister    Hypertension Maternal Grandmother    Hypertension Maternal Grandfather    Diabetes Maternal Grandfather    Hyperlipidemia Paternal Grandmother    Cancer Neg Hx    Alcohol abuse Neg Hx    Drug abuse Neg Hx    Early death Neg Hx    Kidney disease Neg Hx     Social History[1]  Allergies: Allergies[2]  Medications Prior to Admission  Medication Sig Dispense Refill Last  Dose/Taking   omeprazole  (PRILOSEC) 40 MG capsule Take 40 mg by mouth daily.   02/02/2024   Prenatal 27-1 MG TABS Take 1 tablet by mouth daily. 90 tablet 0 02/02/2024   meloxicam  (MOBIC ) 15 MG tablet Take 1 tablet (15 mg total) by mouth daily. (Patient not taking: Reported on 12/07/2023) 30 tablet 0    metFORMIN  (GLUCOPHAGE ) 1000 MG tablet Take 1/2 tablet by mouth daily for one week. Then increase to one tablet daily for one week.  Then one tablet twice a day. Take with meals. (Patient not taking: Reported on 12/07/2023) 60 tablet 1    trimethoprim -polymyxin b  (POLYTRIM ) ophthalmic solution Place 1 drop into the right eye in the morning, at noon, in the evening, and at bedtime. (Patient not taking: Reported on 12/07/2023) 10 mL 0     Review of Systems  Constitutional: Negative.   HENT: Negative.    Eyes: Negative.   Respiratory: Negative.    Cardiovascular: Negative.   Gastrointestinal: Negative.   Endocrine: Negative.   Genitourinary:  Positive for pelvic pain.  Musculoskeletal:  Positive for back pain.  Skin: Negative.   Allergic/Immunologic: Negative.   Neurological: Negative.   Hematological: Negative.   Psychiatric/Behavioral: Negative.     Physical Exam   Blood pressure 137/82, pulse 87, temperature 98 F (36.7 C), temperature source Oral, resp. rate 18, height 5' 5 (1.651 m), weight 104.2 kg, SpO2 100%, not currently breastfeeding.  Physical Exam Vitals and nursing note reviewed.  Constitutional:      Appearance: Normal appearance. She is obese.  Cardiovascular:     Rate and Rhythm: Normal rate.  Pulmonary:     Effort: Pulmonary effort is normal.  Genitourinary:    Comments: Swabs collected by patient using blind swab technique  Musculoskeletal:        General: Normal range of motion.  Skin:    General: Skin is warm and dry.  Neurological:     Mental Status: She is alert and oriented to person, place, and time.  Psychiatric:        Mood and Affect: Mood normal.         Behavior: Behavior normal.        Thought Content: Thought content normal.        Judgment: Judgment normal.     MAU Course  Procedures  MDM CCUA UPT Wet Prep GC/CT -- Results pending  HCG  Results for orders placed or performed during the hospital encounter of 02/03/24 (from the past 24 hours)  Urinalysis, Routine w reflex microscopic -Urine, Clean Catch     Status: None   Collection Time: 02/03/24  4:13 PM  Result Value Ref Range   Color, Urine YELLOW YELLOW   APPearance CLEAR CLEAR   Specific Gravity, Urine 1.020 1.005 - 1.030   pH 6.0 5.0 - 8.0   Glucose, UA NEGATIVE NEGATIVE mg/dL   Hgb urine dipstick NEGATIVE NEGATIVE   Bilirubin Urine NEGATIVE NEGATIVE   Ketones, ur NEGATIVE NEGATIVE mg/dL   Protein, ur NEGATIVE NEGATIVE mg/dL   Nitrite NEGATIVE NEGATIVE   Leukocytes,Ua NEGATIVE NEGATIVE  Wet prep, genital     Status: None   Collection Time: 02/03/24  4:13 PM  Result Value Ref Range   Yeast Wet Prep HPF POC NONE SEEN NONE SEEN   Trich, Wet Prep NONE SEEN NONE SEEN   Clue Cells Wet Prep HPF POC NONE SEEN NONE SEEN   WBC, Wet Prep HPF POC <10 <10   Sperm NONE SEEN   Pregnancy, urine POC     Status: None   Collection Time: 02/03/24  4:18 PM  Result Value Ref Range   Preg Test, Ur NEGATIVE NEGATIVE  hCG, quantitative, pregnancy     Status: Abnormal   Collection Time: 02/03/24  4:30 PM  Result Value Ref Range   hCG, Beta Chain, Quant, S 24 (H) <5 mIU/mL    Assessment and Plan  1. Low maternal serum human chorionic gonadotropin  (hCG) (Primary) - Repeat in 48 hrs  2. Positive pregnancy test  3. [redacted] weeks gestation of pregnancy   - Discharge home - Keep scheduled appt with [facility] on 02/05/2024 @ 0900. Advised to arrive at least 10 mins early. - Patient verbalized an understanding of the plan of care and agrees.   Ala Cart, CNM 02/03/2024, 6:56 PM     [1]  Social History Tobacco Use   Smoking status: Former    Types: E-cigarettes    Quit  date: 2023    Years since quitting: 2.9   Smokeless tobacco: Never  Vaping Use  Vaping status: Never Used  Substance Use Topics   Alcohol use: No   Drug use: No  [2]  Allergies Allergen Reactions   Porcine (Pork) Protein-Containing Drug Products     religious   Other Rash    Henna dye   "

## 2024-02-04 LAB — GC/CHLAMYDIA PROBE AMP (~~LOC~~) NOT AT ARMC
Chlamydia: NEGATIVE
Comment: NEGATIVE
Comment: NORMAL
Neisseria Gonorrhea: NEGATIVE

## 2024-02-05 ENCOUNTER — Ambulatory Visit: Payer: Self-pay | Admitting: Family Medicine

## 2024-02-05 ENCOUNTER — Other Ambulatory Visit: Payer: Self-pay

## 2024-02-05 ENCOUNTER — Other Ambulatory Visit: Payer: Self-pay | Admitting: Obstetrics and Gynecology

## 2024-02-05 ENCOUNTER — Other Ambulatory Visit (INDEPENDENT_AMBULATORY_CARE_PROVIDER_SITE_OTHER): Payer: Self-pay

## 2024-02-05 VITALS — BP 111/69 | HR 78 | Wt 225.0 lb

## 2024-02-05 DIAGNOSIS — Z3A01 Less than 8 weeks gestation of pregnancy: Secondary | ICD-10-CM

## 2024-02-05 DIAGNOSIS — O3680X0 Pregnancy with inconclusive fetal viability, not applicable or unspecified: Secondary | ICD-10-CM

## 2024-02-05 LAB — BETA HCG QUANT (REF LAB): hCG Quant: 60 m[IU]/mL

## 2024-02-05 NOTE — Progress Notes (Signed)
 Beta HCG Follow-up Visit  Amanda Harper presents to [facility] for follow-up beta HCG lab. She was seen in MAU for abdominal cramping and lower back pain on 02/03/24. Patient reports same abdominal cramping and lower back pain today. Discussed with patient that we are following beta HCG levels today. Valid contact number for patient confirmed. I will call the patient with results.   Beta HCG results: 02/03/24 24  02/05/24 60   Results and patient history reviewed with Dr. Lola, who states HCG is rising appropriately, advised patient to come back in Friday, 02/08/24 for another Stat HCG.   1444 Patient called and informed of plan for follow-up. Patient scheduled for repeat Stat bhcg on Friday, 02/08/24 at 0850. Patient confirmed scheduled appointment. Denies any other needs at this time. Reviewed MAU precautions with patient and notified patient to contact our office for any other questions or concerns. Patient voiced understanding.   [name] 02/05/2024 9:08 AM

## 2024-02-08 ENCOUNTER — Encounter: Payer: Self-pay | Admitting: Obstetrics and Gynecology

## 2024-02-08 ENCOUNTER — Other Ambulatory Visit: Payer: Self-pay

## 2024-02-08 ENCOUNTER — Ambulatory Visit: Payer: Self-pay

## 2024-02-08 VITALS — BP 129/80 | HR 118 | Ht 65.0 in | Wt 224.9 lb

## 2024-02-08 DIAGNOSIS — O280 Abnormal hematological finding on antenatal screening of mother: Secondary | ICD-10-CM

## 2024-02-08 DIAGNOSIS — Z3A01 Less than 8 weeks gestation of pregnancy: Secondary | ICD-10-CM

## 2024-02-08 DIAGNOSIS — O26891 Other specified pregnancy related conditions, first trimester: Secondary | ICD-10-CM

## 2024-02-08 LAB — BETA HCG QUANT (REF LAB): hCG Quant: 193 m[IU]/mL

## 2024-02-08 NOTE — Progress Notes (Signed)
 Beta HCG Follow-up Visit  Amanda Harper presents to [facility] for follow-up beta HCG lab. She was seen in MAU for lower abdominal pain/cramping and lower back pain on 02/03/24. Patient denies pain and bleeding today. Discussed with patient that we are following beta HCG levels today.  Valid contact number for patient confirmed. I will call the patient with results. MAU precautions reviewed; patient verbalized understanding. Patient voices no further questions or concerns.  Beta HCG results: 02/03/24 24  02/05/24 60  02/08/24 193   Results and patient history reviewed with Cresenzo, MD who states patient needs to begin prenatal care. MAU precautions reviewed; patient verbalized understanding. Patient called and informed of plan for follow-up.  Devon, RN 02/08/2024

## 2024-02-12 ENCOUNTER — Encounter: Payer: Self-pay | Admitting: Obstetrics and Gynecology

## 2024-02-13 ENCOUNTER — Other Ambulatory Visit: Payer: Self-pay

## 2024-02-13 DIAGNOSIS — O3680X0 Pregnancy with inconclusive fetal viability, not applicable or unspecified: Secondary | ICD-10-CM

## 2024-02-13 MED ORDER — OMEPRAZOLE 40 MG PO CPDR: 40.0000 mg | DELAYED_RELEASE_CAPSULE | Freq: Every day | ORAL | 2 refills | Status: AC

## 2024-02-18 ENCOUNTER — Telehealth: Payer: Self-pay | Admitting: Family Medicine

## 2024-02-18 NOTE — Telephone Encounter (Signed)
 Called patient to and left detailed message about upcoming New ob appt. Left office number for patient to call if she needs to reschedule.

## 2024-02-19 ENCOUNTER — Encounter: Payer: Self-pay | Admitting: Obstetrics and Gynecology

## 2024-02-21 ENCOUNTER — Ambulatory Visit

## 2024-02-21 ENCOUNTER — Other Ambulatory Visit: Payer: Self-pay

## 2024-02-21 DIAGNOSIS — Z3A01 Less than 8 weeks gestation of pregnancy: Secondary | ICD-10-CM | POA: Diagnosis not present

## 2024-02-21 DIAGNOSIS — Z3491 Encounter for supervision of normal pregnancy, unspecified, first trimester: Secondary | ICD-10-CM

## 2024-02-21 DIAGNOSIS — O3680X0 Pregnancy with inconclusive fetal viability, not applicable or unspecified: Secondary | ICD-10-CM

## 2024-02-26 ENCOUNTER — Telehealth: Payer: Self-pay

## 2024-02-26 NOTE — Telephone Encounter (Signed)
 Called patient and reviewed ultrasound results as well as dating information with her. Patient verbalized understanding & had no additional questions.

## 2024-02-26 NOTE — Telephone Encounter (Signed)
 Patient left voicemail stating that she had US  02/21/24 and hasn't heard anything about results.    [place], RN

## 2024-03-17 ENCOUNTER — Telehealth: Payer: Self-pay

## 2024-03-25 ENCOUNTER — Encounter: Payer: Self-pay | Admitting: Advanced Practice Midwife

## 2024-04-09 ENCOUNTER — Telehealth

## 2024-04-10 ENCOUNTER — Telehealth

## 2024-04-17 ENCOUNTER — Encounter: Admitting: Obstetrics & Gynecology
